# Patient Record
Sex: Male | Born: 1955 | Race: White | Hispanic: No | Marital: Married | Smoking: Former smoker
Health system: Southern US, Community
[De-identification: ages and names within clinical notes are randomized; demographics above are authoritative.]

## PROBLEM LIST (undated history)

## (undated) DIAGNOSIS — R002 Palpitations: Secondary | ICD-10-CM

## (undated) DIAGNOSIS — K449 Diaphragmatic hernia without obstruction or gangrene: Secondary | ICD-10-CM

## (undated) DIAGNOSIS — N419 Inflammatory disease of prostate, unspecified: Secondary | ICD-10-CM

## (undated) DIAGNOSIS — E785 Hyperlipidemia, unspecified: Secondary | ICD-10-CM

## (undated) DIAGNOSIS — G8929 Other chronic pain: Secondary | ICD-10-CM

## (undated) DIAGNOSIS — K824 Cholesterolosis of gallbladder: Secondary | ICD-10-CM

## (undated) DIAGNOSIS — K219 Gastro-esophageal reflux disease without esophagitis: Secondary | ICD-10-CM

## (undated) DIAGNOSIS — F419 Anxiety disorder, unspecified: Secondary | ICD-10-CM

## (undated) DIAGNOSIS — M549 Dorsalgia, unspecified: Secondary | ICD-10-CM

## (undated) HISTORY — DX: Inflammatory disease of prostate, unspecified: N41.9

## (undated) HISTORY — DX: Palpitations: R00.2

## (undated) HISTORY — DX: Hyperlipidemia, unspecified: E78.5

## (undated) HISTORY — DX: Diaphragmatic hernia without obstruction or gangrene: K44.9

## (undated) HISTORY — DX: Cholesterolosis of gallbladder: K82.4

---

## 1985-11-27 HISTORY — PX: BACK SURGERY: SHX140

## 1993-11-27 HISTORY — PX: SIGMOIDOSCOPY: SUR1295

## 1993-11-27 HISTORY — PX: ESOPHAGOGASTRODUODENOSCOPY: SHX1529

## 1999-11-25 HISTORY — PX: ESOPHAGOGASTRODUODENOSCOPY: SHX1529

## 1999-11-25 HISTORY — PX: SIGMOIDOSCOPY: SUR1295

## 2001-12-06 ENCOUNTER — Emergency Department (HOSPITAL_COMMUNITY): Admission: EM | Admit: 2001-12-06 | Discharge: 2001-12-06 | Payer: Self-pay | Admitting: *Deleted

## 2001-12-06 ENCOUNTER — Encounter: Payer: Self-pay | Admitting: *Deleted

## 2002-03-27 HISTORY — PX: COLONOSCOPY: SHX174

## 2002-03-28 ENCOUNTER — Ambulatory Visit (HOSPITAL_COMMUNITY): Admission: RE | Admit: 2002-03-28 | Discharge: 2002-03-28 | Payer: Self-pay | Admitting: Internal Medicine

## 2003-08-14 ENCOUNTER — Ambulatory Visit (HOSPITAL_COMMUNITY): Admission: RE | Admit: 2003-08-14 | Discharge: 2003-08-14 | Payer: Self-pay | Admitting: Family Medicine

## 2003-08-14 ENCOUNTER — Encounter: Payer: Self-pay | Admitting: Family Medicine

## 2003-08-18 ENCOUNTER — Ambulatory Visit (HOSPITAL_COMMUNITY): Admission: RE | Admit: 2003-08-18 | Discharge: 2003-08-18 | Payer: Self-pay | Admitting: *Deleted

## 2004-02-25 ENCOUNTER — Ambulatory Visit (HOSPITAL_COMMUNITY): Admission: RE | Admit: 2004-02-25 | Discharge: 2004-02-25 | Payer: Self-pay | Admitting: *Deleted

## 2005-03-07 ENCOUNTER — Ambulatory Visit (HOSPITAL_COMMUNITY): Admission: RE | Admit: 2005-03-07 | Discharge: 2005-03-07 | Payer: Self-pay | Admitting: Urology

## 2007-02-14 ENCOUNTER — Emergency Department (HOSPITAL_COMMUNITY): Admission: EM | Admit: 2007-02-14 | Discharge: 2007-02-14 | Payer: Self-pay | Admitting: Emergency Medicine

## 2007-11-13 ENCOUNTER — Emergency Department (HOSPITAL_COMMUNITY): Admission: EM | Admit: 2007-11-13 | Discharge: 2007-11-13 | Payer: Self-pay | Admitting: Emergency Medicine

## 2007-11-28 HISTORY — PX: ESOPHAGOGASTRODUODENOSCOPY: SHX1529

## 2007-12-09 ENCOUNTER — Ambulatory Visit: Payer: Self-pay | Admitting: Internal Medicine

## 2007-12-09 ENCOUNTER — Ambulatory Visit (HOSPITAL_COMMUNITY): Admission: RE | Admit: 2007-12-09 | Discharge: 2007-12-09 | Payer: Self-pay | Admitting: Internal Medicine

## 2008-02-26 ENCOUNTER — Ambulatory Visit: Payer: Self-pay | Admitting: Internal Medicine

## 2008-04-17 ENCOUNTER — Ambulatory Visit: Payer: Self-pay | Admitting: Internal Medicine

## 2010-04-18 ENCOUNTER — Emergency Department (HOSPITAL_COMMUNITY): Admission: EM | Admit: 2010-04-18 | Discharge: 2010-04-18 | Payer: Self-pay | Admitting: Emergency Medicine

## 2010-06-03 ENCOUNTER — Emergency Department (HOSPITAL_COMMUNITY): Admission: EM | Admit: 2010-06-03 | Discharge: 2010-06-03 | Payer: Self-pay | Admitting: Emergency Medicine

## 2010-06-08 ENCOUNTER — Ambulatory Visit (HOSPITAL_COMMUNITY): Admission: RE | Admit: 2010-06-08 | Discharge: 2010-06-08 | Payer: Self-pay | Admitting: Family Medicine

## 2010-07-05 ENCOUNTER — Encounter: Payer: Self-pay | Admitting: Internal Medicine

## 2010-07-05 ENCOUNTER — Ambulatory Visit: Payer: Self-pay | Admitting: Gastroenterology

## 2010-07-05 DIAGNOSIS — R1013 Epigastric pain: Secondary | ICD-10-CM | POA: Insufficient documentation

## 2010-07-05 DIAGNOSIS — K828 Other specified diseases of gallbladder: Secondary | ICD-10-CM | POA: Insufficient documentation

## 2010-07-05 DIAGNOSIS — Z860100 Personal history of colon polyps, unspecified: Secondary | ICD-10-CM | POA: Insufficient documentation

## 2010-07-08 ENCOUNTER — Encounter: Payer: Self-pay | Admitting: Gastroenterology

## 2010-07-11 LAB — CONVERTED CEMR LAB
Eosinophils Absolute: 0.3 10*3/uL (ref 0.0–0.7)
Eosinophils Relative: 6 % — ABNORMAL HIGH (ref 0–5)
HCT: 41.4 % (ref 39.0–52.0)
Hemoglobin: 14.1 g/dL (ref 13.0–17.0)
Lymphs Abs: 1.3 10*3/uL (ref 0.7–4.0)
MCV: 88.5 fL (ref 78.0–100.0)
Monocytes Relative: 9 % (ref 3–12)
Neutrophils Relative %: 62 % (ref 43–77)
RBC: 4.68 M/uL (ref 4.22–5.81)
WBC: 5.6 10*3/uL (ref 4.0–10.5)

## 2010-07-29 ENCOUNTER — Ambulatory Visit: Payer: Self-pay | Admitting: Internal Medicine

## 2010-07-29 ENCOUNTER — Ambulatory Visit (HOSPITAL_COMMUNITY): Admission: RE | Admit: 2010-07-29 | Discharge: 2010-07-29 | Payer: Self-pay | Admitting: Internal Medicine

## 2010-07-29 HISTORY — PX: ESOPHAGOGASTRODUODENOSCOPY: SHX1529

## 2010-07-29 HISTORY — PX: COLONOSCOPY: SHX174

## 2010-08-05 ENCOUNTER — Encounter (HOSPITAL_COMMUNITY): Admission: RE | Admit: 2010-08-05 | Discharge: 2010-08-05 | Payer: Self-pay | Admitting: Internal Medicine

## 2010-08-06 ENCOUNTER — Encounter: Payer: Self-pay | Admitting: Internal Medicine

## 2010-08-23 ENCOUNTER — Encounter (INDEPENDENT_AMBULATORY_CARE_PROVIDER_SITE_OTHER): Payer: Self-pay | Admitting: *Deleted

## 2010-12-29 NOTE — Letter (Signed)
Summary: TCS/EGD order   TCS/EGD order   Imported By: Peggyann Shoals 07/05/2010 11:42:41  _____________________________________________________________________  External Attachment:    Type:   Image     Comment:   External Document
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
## 2010-12-29 NOTE — Letter (Signed)
Summary: Recall Office Visit  [facility]  [address]   [place], [place] [address]   Phone: [phone]  Fax: [phone]      August 23, 2010   Jack Gilbert [address] [place], [place]  [address] 12/28/1955   Dear Mr. Badia,   According to our records, it is time for you to schedule a follow-up office visit with [place].   At your convenience, please call [phone] to schedule an office visit. If you have any questions, concerns, or feel that this letter is in error, we would appreciate your call.     [facility] Ph: [phone]   Fax: [phone]

## 2010-12-29 NOTE — Letter (Signed)
Summary: Patient Notice, Endo Biopsy Results  [facility]  [address]   [place], [place] [address]   Phone: [phone]  Fax: [phone]       August 06, 2010   THOAMS SIEFERT [address] [place], [place]  [address] 17-Nov-1956    Dear Mr. Furey,  I am pleased to inform you that the biopsies taken during your recent endoscopic examination did not show any evidence of cancer upon pathologic examination.  There was only mild inflammation in your stomach.  Additional information/recommendations:  Continue with the treatment plan as outlined on the day of your examination.  Please call us if you are having persistent problems or have questions about your condition that have not been fully answered at this time.  Sincerely,    R. Roetta Sessions MD, FACP [facility] Ph: [phone]   Fax: [phone]   Appended Document: Patient Notice, Endo Biopsy Results mailed letter to pt

## 2011-02-12 LAB — URINALYSIS, ROUTINE W REFLEX MICROSCOPIC
Glucose, UA: NEGATIVE mg/dL
Hgb urine dipstick: NEGATIVE
Protein, ur: NEGATIVE mg/dL

## 2011-02-12 LAB — BASIC METABOLIC PANEL
CO2: 25 mEq/L (ref 19–32)
Chloride: 107 mEq/L (ref 96–112)
Creatinine, Ser: 0.73 mg/dL (ref 0.4–1.5)
GFR calc Af Amer: >60 mL/min (ref >60)

## 2011-04-11 NOTE — Letter (Signed)
February 26, 2008    Scott A. Gerda Diss, MD  [address], [address]  [place], [place] [address]   RE:  Jack Gilbert, Jack Gilbert  MRN:  [id number]  /  DOB:  Jun 02, 1956   Dear Lorin Picket:   It was a pleasure to see Donell Tomkins today, although he came without  very much data.   He describes palpitations over the last 3 years which are accompanied by  fatigue, worse at night, worse on his left side.  He feels them in his  neck.  He has been told that he has premature beats.  He is not sure  if he has been told that they were atrial or ventricular.  He is not  quite sure that the diagnosis is even correct.   He has been submitted to a fairly extensive workup by the [facility], which has included an echo that was normal, a stress  test that was normal, 24-hour Holter monitoring, event loop recording  and LifeWatch monitoring.  Unfortunately, the latter three we do not  have.   His past medical history in addition to the above is notable for  fatigue, prostate issues and GE reflux disease as well as allergies.   He has a history of back surgery.   SOCIAL HISTORY:  He is married.  He is two children.  He does not use  cigarettes or recreational drugs.  He drinks a couple of beers a day.  He does not use caffeine.   He works as a Psychologist, occupational.   MEDICATIONS:  Metoprolol succinate 100 mg a day.   He has no known drug allergies.   EXAMINATION:  He is a middle-aged Caucasian male appearing his stated  age of 55.  His blood pressure is 126/77, his pulse was 70.  His weight was 184.  He is in no acute distress.  HEENT:  No icterus or xanthoma.  The neck veins were flat.  The carotids are brisk and full bilaterally  out bruits.  BACK:  Without kyphosis or scoliosis.  LUNGS:  Clear.  Heart sounds were regular without murmurs or gallops.  ABDOMEN:  Soft with active bowel sounds, without midline pulsation or  hepatomegaly.  Femoral pulses were 2+.  Distal pulses were intact.  There is no  clubbing, cyanosis or edema.  NEUROLOGIC:  Grossly normal.  SKIN:  Warm and dry.   Electrocardiogram today demonstrated sinus rhythm.     Electrocardiogram from your office that is undated except to assume  that it was done last year because it describes a 55 year old  demonstrates sinus rhythm at 68 with intervals of 0.16/0.8/0.36.  The  electrocardiogram was normal.   IMPRESSION:  1. Palpitations, question mechanism.  2. Anxiety related to #1.  3. Fairly extensive evaluation of heart muscle function, which is      normal.   Lorin Picket, Mr. Crescenzo has palpitations.  We will need to get the records from  [facility] to see what the show in terms of the  mechanism for this and then think about what therapies with might be  able to undertake to alleviate Mr. Jani's symptoms.   Thanks very much for the consultation.  I am sorry that we did have more  records with which to give a more definitive plan.    Sincerely,      Duke Salvia, MD, [facility]  Electronically Signed    SCK/MedQ  DD: 02/26/2008  DT: 02/27/2008  Job #: [id number]

## 2011-04-11 NOTE — Letter (Signed)
Apr 17, 2008    Scott A. Gerda Diss, MD  [address], [address]  [place], [place] [address]   RE:  PASQUAL, FARIAS  MRN:  [id number]  /  DOB:  1955-12-11   Dear Lorin Picket:   Mr. Deman came in today with the recordings that we had obtained from  the [facility] in [place].  These were  typical for his palpitations and they demonstrated PVCs, occurring both  infrequently, as well as in patterns of trigeminy.   He remains very anxious about this.  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   His current medications include metoprolol, he is taking 100 mg a day,  as well as vitamins.   PHYSICAL EXAMINATION:  VITAL SIGNS:  His blood pressure was 110/74.  The  pulse was 67.  His weight was 181, which is down 3 pounds.  LUNGS:  Clear.  HEART:  His heart sounds were regular.  EXTREMITIES:  Without edema.   IMPRESSION:  1. Symptomatic premature ventricular contractions.  2. Anxiety.   Lorin Picket, Guidant Mr. Bashor is doing pretty well.  I tried to reassure him  about the benign nature of his PVCs.  I have given him a prescription  for verapamil to take 40 mg on an as-needed basis to supplement his  metoprolol.   He asked that we sit down again at the end of the summer, so I have  scheduled him in September.   If there is anything I can do in the interim, please do not hesitate to  contact me.    Sincerely,      Duke Salvia, MD, [facility]  Electronically Signed    SCK/MedQ  DD: 04/17/2008  DT: 04/17/2008  Job #: [id number]

## 2011-04-11 NOTE — Op Note (Signed)
Jack Gilbert, Jack Gilbert               ACCOUNT NO.:  [id number]   MEDICAL RECORD NO.:  [id number]          PATIENT TYPE:  AMB   LOCATION:  DAY                           FACILITY:  [facility]   PHYSICIAN:  R. Roetta Sessions, M.D. DATE OF BIRTH:  December 28, 1955   DATE OF PROCEDURE:  12/09/2007  DATE OF DISCHARGE:                               OPERATIVE REPORT   S.  For indicating of the Diagnostic EGD cavity done below [id number] 03/03  days taken on 10/2008   INDICATIONS FOR PROCEDURE:  This 55 year old gentleman with a several  week history of vague and epigastric gnawing discomfort somewhat  relieved by eating.  He describes early satiety.  He has not had a  odynophagia, dysphagia, hematemesis, or melena.  He does chew tobacco.  He tells me H. pylori serologies were negative through Dr. Fletcher Anon  office recently. He had a __________  bout of nausea and vomiting  several weeks ago for which an ultrasound was done which revealed a  normal gallbladder.  H. pylori serologies were negative through Dr.  Fletcher Anon office recently.  Reportedly EGD is now being done.   He is not any acid suppression therapy.  There is no history of peptic  ulcer disease.  EGD is now being done.  This approach has been discussed  with the patient at length.  Potential risks, benefits, and alternatives  have been reviewed, questions answered.  He is agreeable.  Please see  documentation in the medical record.   PROCEDURE NOTE:  O2 saturation, blood pressure, pulse, and respirations  monitored throughout the entire procedure.   CONSCIOUS SEDATION:  Versed 4 mg IV, Demerol 100 mg IV in divided doses.   INSTRUMENT:  Pentax video chip system.   FINDINGS:  Examination of the tubular esophagus revealed entirely normal  mucosa.  EG junction easily traversed.   STOMACH:  Gastric cavity was emptied, insufflated well with air.  A  thorough examination of the gastric mucosa including retroflexion  through the proximal stomach,  esophagogastric junction demonstrated no  abnormalities.  Pylorus patent, and easily traversed.  Examination of  the bulb, and second portion revealed no abnormalities.   THERAPEUTIC/DIAGNOSTIC MANEUVERS PERFORMED:  None.   The patient tolerated the procedure well and was reacted in endoscopy.   IMPRESSION:  Normal esophagus, stomach D1, D2.   RECOMMENDATIONS:  Will treat for non-ulcer dyspepsia for the time-being.  AcipHex 20 mg orally daily before breakfast.  He is to go to by my  office for samples for a month.  We will see him back in 1 month, and  see how he is doing.  Depending on his response to therapy, further  evaluation may be needed.      [name], M.D.  Electronically Signed     RMR/MEDQ  D:  12/09/2007  T:  12/09/2007  Job:  [id number]   cc:   Dr. Gerda Diss

## 2011-04-14 NOTE — Procedures (Signed)
Jack Gilbert, Jack Gilbert NO.:  [id number]   MEDICAL RECORD NO.:  [id number]                  PATIENT TYPE:  PREC   LOCATION:                                       FACILITY:   PHYSICIAN:  Vida Roller, M.D.                DATE OF BIRTH:  05-14-1956   DATE OF PROCEDURE:  DATE OF DISCHARGE:                                    STRESS TEST   PROCEDURE:  Exercise Cardiolite.   INDICATION:  Jack Gilbert is a 55 year old gentleman with no known coronary  artery disease who presents with atypical chest discomfort.   CARDIAC RISK FACTORS:  Tobacco and hypertension.   BASELINE DATA:  EKG reveals sinus rhythm at 71 beats per minute with  nonspecific ST - abnormalities.  Blood pressure 142/78.   DESCRIPTION OF PROCEDURE:  The patient exercised for a total of 7 minutes to  Bruce protocol stage 3, and 10.1 mets.  Maximum heart rate is 164 beats per  minute which is 94% of predicted maximum.  The maximum blood pressure is  192/88.  This resolved down to 128/68 in recovery.  The patient reported a  left anterior rib discomfort which was mild and resolved quickly in  recovery.  EKG revealed no ischemic changes and no arrhythmias.  The test  was stopped secondary to fatigue.   FINAL IMAGES AND RESULTS:  Pending M.D. review.     ________________________________________  ___________________________________________  Jae Dire, P.A. [facility]                      Vida Roller, M.D.   AB/MEDQ  D:  02/25/2004  T:  02/25/2004  Job:  [id number]

## 2011-04-14 NOTE — Procedures (Signed)
   NAME:  Jack Gilbert, Jack Gilbert                         ACCOUNT NO.:  [id number]   MEDICAL RECORD NO.:  [id number]                   PATIENT TYPE:  OUT   LOCATION:  RAD                                  FACILITY:  [facility]   PHYSICIAN:  Vida Roller, M.D.                DATE OF BIRTH:  05-09-1956   DATE OF PROCEDURE:  08/18/2003  DATE OF DISCHARGE:                                  ECHOCARDIOGRAM   TAPE NUMBER:  [id number].   TAPE COUNT:  [id number].   INDICATIONS FOR PROCEDURE:  This is a 55 year old gentleman with presyncope.   TECHNICAL QUALITY:  Technical quality of the study is adequate.   M-MODE TRACINGS:  The aorta is 32 mm.   Left atrium is 32 mm.   The septum is 10 mm.   Left ventricular posterior wall is 9 mm.   Left ventricular diastolic dimension is 47 mm.   Left ventricular posterior wall is 9 mm.   Left ventricular diastolic dimension is 47 mm.   Left ventricular systolic dimension is 28 mm.   2-D AND DOPPLER IMAGING:  The left ventricular is normal size with normal  systolic function. There are no wall motion abnormalities. Diastolic  function is normal.   The right ventricle is normal size with normal systolic function. No wall  motion abnormalities are seen.   Both atria appear to be normal size. There is no atrial septal defect.   The aortic valve is morphologically unremarkable with no stenosis or  regurgitation.   The mitral valve is morphologically unremarkable with no stenosis or  regurgiation.   The tricuspid valve has trace insufficiency but no stenosis is seen.   The pulmonic valve shows trace insufficiency. No stenosis is seen.   Pericardial structures appear normal.   The inferior vena cava appears normal.   The ascending aorta is not well seen.      ___________________________________________                                            Vida Roller, M.D.   JH/MEDQ  D:  08/18/2003  T:  08/18/2003  Job:  [id number]   cc:   Donna Bernard, M.D.  [address]  [place]  [place] [address]  Fax: [phone]

## 2011-04-14 NOTE — Op Note (Signed)
[facility]  Patient:    [name], [name] Visit Number: [id number] MRN: [id number]          Service Type: END Location: DAY Attending Physician:  [name] Dictated by:   Roetta Sessions, M.D. Proc. Date: 03/28/02 Admit Date:  03/28/2002 Discharge Date: 03/28/2002   CC:         Butch Penny, M.D.   Operative Report  PROCEDURE:  Diagnostic colonoscopy.  INDICATIONS FOR PROCEDURE:  The patient is a 55 year old gentleman with vague left lower quadrant abdominal pain. CT at [facility] demonstrated some mild diverticular changes of the sigmoid colon but no inflammatory process was seen. Urinalysis repeat has been normal. Colonoscopy is now being done for the above mentioned reasons. The approach has been discussed with Jack Gilbert. The potential risks, benefits, and alternatives have been reviewed, questions answered and he is agreeable. Please see my handwritten H&P for more information.  MONITORING:  O2 saturations, blood pressure, pulse and respirations were monitored throughout the entire procedure.  CONSCIOUS SEDATION:  Versed 3 mg IV, Demerol 50 IV in divided doses.  INSTRUMENT:  Olympus video colonoscope.  FINDINGS:  Digital rectal exam revealed no abnormalities.  ENDOSCOPIC FINDINGS:  The prep was good.  RECTUM:  Examination of the rectal mucosal including retroflexed view of the anal verge revealed no abnormalities.  COLON:  The colonic mucosa was surveyed from the rectosigmoid junction through the left transverse right colon to the area of the appendiceal orifice, ileocecal valve, and cecum. These structures were well seen and photographed for the record. No colonic mucosal abnormalities were noted upon advancing the scope to the cecum. From the level of the cecum and ileocecal valve, the scope was slowly withdrawn. All previously mentioned mucosal surfaces were again seen and again no abnormalities were  observed. The patient tolerated the procedure well and was reactive in endoscopy.  IMPRESSION:  1. Normal rectum.  2. Normal colon.  No explanation for the patients left lower quadrant abdominal pain. He had mild diverticular changes on CT scan; however, if he does not have any typical changes of diverticulosis based on this colonoscopy, he may have very shallow lesions but he did not have any typical diverticula endoscopically.  RECOMMENDATIONS:  1. Bolster fiber intake and a daily Metamucil or Citrucel fiber supplement.  2. Follow-up with Dr. Renard Matter.  3. Call me if symptoms do not get better. Dictated by:   Roetta Sessions, M.D. Attending Physician:  [name] DD:  03/28/02 TD:  03/31/02 Job: [id number] ON/GE952

## 2011-09-01 LAB — CBC
HCT: 46
Hemoglobin: 15.9
MCHC: 34.5
MCV: 87.7
Platelets: 214
RBC: 5.24
RDW: 12.4
WBC: 7.6

## 2011-09-01 LAB — DIFFERENTIAL
Basophils Absolute: 0
Basophils Relative: 0
Eosinophils Relative: 2
Monocytes Absolute: 0.4

## 2011-09-01 LAB — HEPATIC FUNCTION PANEL
ALT: 54 — ABNORMAL HIGH
AST: 32
Albumin: 4
Alkaline Phosphatase: 71
Bilirubin, Direct: 0.1
Indirect Bilirubin: 0.9
Total Bilirubin: 1
Total Protein: 6.8

## 2011-09-01 LAB — BASIC METABOLIC PANEL
CO2: 23
Calcium: 8.9
Chloride: 103
Glucose, Bld: 115 — ABNORMAL HIGH
Potassium: 3.9
Sodium: 138

## 2011-12-29 ENCOUNTER — Ambulatory Visit (HOSPITAL_COMMUNITY)
Admission: RE | Admit: 2011-12-29 | Discharge: 2011-12-29 | Disposition: A | Discharge status: Home / Self Care | Payer: BC Managed Care – PPO | Source: Ambulatory Visit | Attending: Family Medicine | Admitting: Family Medicine

## 2011-12-29 ENCOUNTER — Other Ambulatory Visit: Payer: Self-pay | Admitting: Family Medicine

## 2011-12-29 DIAGNOSIS — M546 Pain in thoracic spine: Secondary | ICD-10-CM

## 2011-12-29 DIAGNOSIS — M4120 Other idiopathic scoliosis, site unspecified: Secondary | ICD-10-CM | POA: Insufficient documentation

## 2012-01-02 ENCOUNTER — Other Ambulatory Visit: Payer: Self-pay | Admitting: Family Medicine

## 2012-01-02 DIAGNOSIS — M546 Pain in thoracic spine: Secondary | ICD-10-CM

## 2012-01-05 ENCOUNTER — Ambulatory Visit (HOSPITAL_COMMUNITY)
Admission: RE | Admit: 2012-01-05 | Discharge: 2012-01-05 | Disposition: A | Discharge status: Home / Self Care | Payer: BC Managed Care – PPO | Source: Ambulatory Visit | Attending: Family Medicine | Admitting: Family Medicine

## 2012-01-05 ENCOUNTER — Other Ambulatory Visit: Payer: Self-pay | Admitting: Family Medicine

## 2012-01-05 DIAGNOSIS — M5124 Other intervertebral disc displacement, thoracic region: Secondary | ICD-10-CM | POA: Insufficient documentation

## 2012-01-05 DIAGNOSIS — M47814 Spondylosis without myelopathy or radiculopathy, thoracic region: Secondary | ICD-10-CM | POA: Insufficient documentation

## 2012-01-05 DIAGNOSIS — Z01818 Encounter for other preprocedural examination: Secondary | ICD-10-CM

## 2012-01-05 DIAGNOSIS — M546 Pain in thoracic spine: Secondary | ICD-10-CM | POA: Insufficient documentation

## 2013-02-28 ENCOUNTER — Encounter: Payer: Self-pay | Admitting: Internal Medicine

## 2013-02-28 ENCOUNTER — Ambulatory Visit (INDEPENDENT_AMBULATORY_CARE_PROVIDER_SITE_OTHER): Payer: BC Managed Care – PPO | Admitting: Internal Medicine

## 2013-02-28 VITALS — BP 115/68 | HR 70 | Temp 97.4°F | Ht 71.0 in | Wt 187.6 lb

## 2013-02-28 DIAGNOSIS — R1011 Right upper quadrant pain: Secondary | ICD-10-CM

## 2013-02-28 DIAGNOSIS — R1013 Epigastric pain: Secondary | ICD-10-CM

## 2013-02-28 DIAGNOSIS — K3189 Other diseases of stomach and duodenum: Secondary | ICD-10-CM

## 2013-02-28 DIAGNOSIS — K219 Gastro-esophageal reflux disease without esophagitis: Secondary | ICD-10-CM

## 2013-02-28 DIAGNOSIS — G8929 Other chronic pain: Secondary | ICD-10-CM

## 2013-02-28 NOTE — Patient Instructions (Addendum)
Return stool sample for occult blood testing  Gallbladder ultrasound to follow-up on polyps  Trial of Dexilant 60 mg daily; samples provided; stop Protonix (pantoprazole)   Further recommendations  CC: PCP
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
## 2013-03-03 ENCOUNTER — Ambulatory Visit (INDEPENDENT_AMBULATORY_CARE_PROVIDER_SITE_OTHER): Payer: BC Managed Care – PPO | Admitting: Internal Medicine

## 2013-03-03 ENCOUNTER — Other Ambulatory Visit: Payer: Self-pay | Admitting: Internal Medicine

## 2013-03-03 ENCOUNTER — Ambulatory Visit (HOSPITAL_COMMUNITY)
Admission: RE | Admit: 2013-03-03 | Discharge: 2013-03-03 | Disposition: A | Discharge status: Home / Self Care | Payer: BC Managed Care – PPO | Source: Ambulatory Visit | Attending: Internal Medicine | Admitting: Internal Medicine

## 2013-03-03 DIAGNOSIS — R1011 Right upper quadrant pain: Secondary | ICD-10-CM

## 2013-03-03 DIAGNOSIS — K824 Cholesterolosis of gallbladder: Secondary | ICD-10-CM | POA: Insufficient documentation

## 2013-03-04 ENCOUNTER — Ambulatory Visit (HOSPITAL_COMMUNITY): Payer: BC Managed Care – PPO

## 2013-03-05 ENCOUNTER — Other Ambulatory Visit: Payer: Self-pay | Admitting: Internal Medicine

## 2013-03-05 DIAGNOSIS — R1013 Epigastric pain: Secondary | ICD-10-CM

## 2013-03-05 DIAGNOSIS — R1011 Right upper quadrant pain: Secondary | ICD-10-CM

## 2013-03-05 DIAGNOSIS — K219 Gastro-esophageal reflux disease without esophagitis: Secondary | ICD-10-CM

## 2013-03-05 MED ORDER — PEG 3350-KCL-NA BICARB-NACL 420 G PO SOLR: 4000.0000 mL | ORAL | Status: DC

## 2013-03-05 NOTE — Progress Notes (Signed)
Patient is scheduled with RMR on 04/25 I have spoken to his wife and I have mailed the instructions

## 2013-03-11 ENCOUNTER — Encounter (HOSPITAL_COMMUNITY): Payer: Self-pay | Admitting: Pharmacy Technician

## 2013-03-21 ENCOUNTER — Encounter (HOSPITAL_COMMUNITY)
Admission: RE | Disposition: A | Discharge status: Home / Self Care | Payer: Self-pay | Source: Ambulatory Visit | Attending: Internal Medicine

## 2013-03-21 ENCOUNTER — Ambulatory Visit (HOSPITAL_COMMUNITY)
Admission: RE | Admit: 2013-03-21 | Discharge: 2013-03-21 | Disposition: A | Discharge status: Home / Self Care | Payer: BC Managed Care – PPO | Source: Ambulatory Visit | Attending: Internal Medicine | Admitting: Internal Medicine

## 2013-03-21 ENCOUNTER — Encounter (HOSPITAL_COMMUNITY): Payer: Self-pay | Admitting: *Deleted

## 2013-03-21 DIAGNOSIS — K621 Rectal polyp: Secondary | ICD-10-CM

## 2013-03-21 DIAGNOSIS — D128 Benign neoplasm of rectum: Secondary | ICD-10-CM | POA: Insufficient documentation

## 2013-03-21 DIAGNOSIS — K219 Gastro-esophageal reflux disease without esophagitis: Secondary | ICD-10-CM

## 2013-03-21 DIAGNOSIS — K620 Anal polyp: Secondary | ICD-10-CM

## 2013-03-21 DIAGNOSIS — R1011 Right upper quadrant pain: Secondary | ICD-10-CM

## 2013-03-21 DIAGNOSIS — R1013 Epigastric pain: Secondary | ICD-10-CM

## 2013-03-21 DIAGNOSIS — R109 Unspecified abdominal pain: Secondary | ICD-10-CM

## 2013-03-21 DIAGNOSIS — K449 Diaphragmatic hernia without obstruction or gangrene: Secondary | ICD-10-CM | POA: Insufficient documentation

## 2013-03-21 DIAGNOSIS — R195 Other fecal abnormalities: Secondary | ICD-10-CM

## 2013-03-21 HISTORY — PX: COLONOSCOPY WITH ESOPHAGOGASTRODUODENOSCOPY (EGD): SHX5779

## 2013-03-21 SURGERY — COLONOSCOPY WITH ESOPHAGOGASTRODUODENOSCOPY (EGD)
Anesthesia: Moderate Sedation

## 2013-03-21 MED ORDER — SODIUM CHLORIDE 0.9 % IV SOLN
INTRAVENOUS | Status: DC
  Administered 2013-03-21: 1000 mL via INTRAVENOUS

## 2013-03-21 MED ORDER — ONDANSETRON HCL 4 MG/2ML IJ SOLN
INTRAMUSCULAR | Status: AC
  Filled 2013-03-21: qty 2

## 2013-03-21 MED ORDER — MIDAZOLAM HCL 5 MG/5ML IJ SOLN
INTRAMUSCULAR | Status: AC
  Filled 2013-03-21: qty 10

## 2013-03-21 MED ORDER — MEPERIDINE HCL 100 MG/ML IJ SOLN
INTRAMUSCULAR | Status: DC | PRN
  Administered 2013-03-21 (×2): 50 mg via INTRAVENOUS

## 2013-03-21 MED ORDER — MEPERIDINE HCL 100 MG/ML IJ SOLN
INTRAMUSCULAR | Status: AC
  Filled 2013-03-21: qty 2

## 2013-03-21 MED ORDER — STERILE WATER FOR IRRIGATION IR SOLN
Status: DC | PRN
  Administered 2013-03-21: 14:00:00

## 2013-03-21 MED ORDER — MIDAZOLAM HCL 5 MG/5ML IJ SOLN
INTRAMUSCULAR | Status: DC | PRN
  Administered 2013-03-21 (×3): 2 mg via INTRAVENOUS

## 2013-03-21 MED ORDER — ONDANSETRON HCL 4 MG/2ML IJ SOLN
INTRAMUSCULAR | Status: DC | PRN
  Administered 2013-03-21: 4 mg via INTRAVENOUS

## 2013-03-21 NOTE — Op Note (Signed)
[facility] [address] [place], [address]   COLONOSCOPY PROCEDURE REPORT  PATIENT: Jack, Gilbert  MR#:         [id number] BIRTHDATE: 03/07/1956 , 56  yrs. old GENDER: Male ENDOSCOPIST: R.  Roetta Sessions, MD FACP FACG REFERRED BY:  Lilyan Punt, M.D. PROCEDURE DATE:  03/21/2013 PROCEDURE:      Colonoscopy with snare polypectomy  INDICATIONS:   Occult blood positive stool  INFORMED CONSENT:  The risks, benefits, alternatives and imponderables including but not limited to bleeding, perforation as well as the possibility of a missed lesion have been reviewed.  The potential for biopsy, lesion removal, etc. have also been discussed.  Questions have been answered.  All parties agreeable. Please see the history and physical in the medical record for more information.  MEDICATIONS: Versed 6 mg IV and Demerol 100 mg IV in divided doses. Zofran 4 mg  DESCRIPTION OF PROCEDURE:  After a digital rectal exam was performed, the EC-[id number] ([id number])  colonoscope was advanced from the anus through the rectum and colon to the area of the cecum, ileocecal valve and appendiceal orifice.  The cecum was deeply intubated.  These structures were well-seen and photographed for the record.  From the level of the cecum and ileocecal valve, the scope was slowly and cautiously withdrawn.  The mucosal surfaces were carefully surveyed utilizing scope tip deflection to facilitate fold flattening as needed.  The scope was pulled down into the rectum where a thorough examination including retroflexion was performed.    FINDINGS:  (1) 3 mm polyp in the rectum  - 7 cm in from the anal verge; otherwise, the remainder of the rectal mucosa appeared normal. Normal appearing colonic mucosa.  THERAPEUTIC / DIAGNOSTIC MANEUVERS PERFORMED:  The above-mentioned rectal polyp was hot snare removed and recovered.  COMPLICATIONS: None  CECAL WITHDRAWAL TIME:  10 minutes  IMPRESSION:  Rectal  polyp-removed as described above  RECOMMENDATIONS: Followup on pathology.  We'll proceed with a HIDA with CCK challenge to reassess  gallbladder function. See EGD report   _______________________________ eSigned:  R. Roetta Sessions, MD FACP [facility] 03/21/2013 2:41 PM   CC:    PATIENT NAME:  Jack, Gilbert MR#: [id number]

## 2013-03-21 NOTE — Op Note (Signed)
[facility] [address] [place], [address]   ENDOSCOPY PROCEDURE REPORT  PATIENT: Jack, Gilbert  MR#: [id number] BIRTHDATE: Jan 31, 1956 , 56  yrs. old GENDER: Male ENDOSCOPIST: R.  Roetta Sessions, MD FACP FACG REFERRED BY:  Lilyan Punt, M.D. PROCEDURE DATE:  03/21/2013 PROCEDURE:     EGD-diagnostic  INDICATIONS:     upper abdominal pain; occult blood positive stool  INFORMED CONSENT:   The risks, benefits, limitations, alternatives and imponderables have been discussed.  The potential for biopsy, esophogeal dilation, etc. have also been reviewed.  Questions have been answered.  All parties agreeable.  Please see the history and physical in the medical record for more information.  MEDICATIONS:   Versed 4 mg IV and Demerol 100 mg IV in divided doses. Zofran 4 mg IV and Cetacaine spray  DESCRIPTION OF PROCEDURE:   The AO-[id number] ([id number])  endoscope was introduced through the mouth and advanced to the second portion of the duodenum without difficulty or limitations.  The mucosal surfaces were surveyed very carefully during advancement of the scope and upon withdrawal.  Retroflexion view of the proximal stomach and esophagogastric junction was performed.      FINDINGS:    Normal esophagus. Stomach empty. Normal-appearing gastric mucosa. One to 2 cm hiatal hernia. Patent pylorus. First and second portion of the duodenum.  THERAPEUTIC / DIAGNOSTIC MANEUVERS PERFORMED:  None   COMPLICATIONS:  None  IMPRESSION:  Small hiatal hernia; otherwise normal examination  RECOMMENDATIONS:  See colonoscopy report    _______________________________ R. Roetta Sessions, MD FACP [facility] eSigned:  R. Roetta Sessions, MD FACP [facility] 03/21/2013 2:23 PM     CC:  PATIENT NAME:  Jack, Gilbert MR#: [id number]
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
## 2013-03-21 NOTE — Interval H&P Note (Signed)
History and Physical Interval Note:  03/21/2013 2:05 PM  Jack Gilbert  has presented today for surgery, with the diagnosis of RUQ ABD PAIN, GERD, DYSPEPSIA  The various methods of treatment have been discussed with the patient and family. After consideration of risks, benefits and other options for treatment, the patient has consented to  Procedure(s) with comments: COLONOSCOPY WITH ESOPHAGOGASTRODUODENOSCOPY (EGD) (N/A) - 2:00-moved to 2:15 Jack Gilbert to notify pt as a surgical intervention .  The patient's history has been reviewed, patient examined, no change in status, stable for surgery.  I have reviewed the patient's chart and labs.  Questions were answered to the patient's satisfaction.     EGD colonoscopy per plan.The risks, benefits, limitations, imponderables and alternatives regarding both EGD and colonoscopy have been reviewed with the patient. Questions have been answered. All parties agreeable.    Jack Gilbert

## 2013-03-24 ENCOUNTER — Telehealth: Payer: Self-pay | Admitting: Internal Medicine

## 2013-03-24 ENCOUNTER — Other Ambulatory Visit: Payer: Self-pay | Admitting: Internal Medicine

## 2013-03-24 DIAGNOSIS — R110 Nausea: Secondary | ICD-10-CM

## 2013-03-24 DIAGNOSIS — R1011 Right upper quadrant pain: Secondary | ICD-10-CM

## 2013-03-24 NOTE — Telephone Encounter (Signed)
Patient is scheduled for HIDA on Wednesday April 30th at 1-:00 am and he is aware

## 2013-03-25 ENCOUNTER — Encounter (HOSPITAL_COMMUNITY): Payer: Self-pay | Admitting: Internal Medicine

## 2013-03-26 ENCOUNTER — Encounter (HOSPITAL_COMMUNITY): Payer: Self-pay

## 2013-03-26 ENCOUNTER — Encounter: Payer: Self-pay | Admitting: Internal Medicine

## 2013-03-26 ENCOUNTER — Encounter (HOSPITAL_COMMUNITY)
Admission: RE | Admit: 2013-03-26 | Discharge: 2013-03-26 | Disposition: A | Discharge status: Home / Self Care | Payer: BC Managed Care – PPO | Source: Ambulatory Visit | Attending: Internal Medicine | Admitting: Internal Medicine

## 2013-03-26 DIAGNOSIS — R1011 Right upper quadrant pain: Secondary | ICD-10-CM

## 2013-03-26 DIAGNOSIS — R110 Nausea: Secondary | ICD-10-CM

## 2013-03-26 MED ORDER — SINCALIDE 5 MCG IJ SOLR
INTRAMUSCULAR | Status: AC
  Administered 2013-03-26: 1.68 ug via INTRAVENOUS
  Filled 2013-03-26: qty 5

## 2013-03-26 MED ORDER — TECHNETIUM TC 99M MEBROFENIN IV KIT
5.0000 | PACK | Freq: Once | INTRAVENOUS | Status: AC | PRN
  Administered 2013-03-26: 5 via INTRAVENOUS

## 2013-03-27 NOTE — Progress Notes (Signed)
Referral faxed to Dr. Jenkins/Ziegler  

## 2013-03-28 NOTE — Telephone Encounter (Signed)
Patient is scheduled with Dr. Lovell Sheehan on 04/03/13 at 9:30 an he is aware

## 2013-04-03 NOTE — H&P (Signed)
  NTS SOAP Note  Vital Signs:  Vitals as of: 04/03/2013: Systolic 148: Diastolic 89: Heart Rate 80: Temp 98.66F: Height 8ft 11in: Weight 182Lbs 0 Ounces: Pain Level 7: BMI 25.38  BMI : 25.38 kg/m2  Subjective: This 38 Years 61 Months old Male presents for of    ABDOMINAL ISSUES: ,Has been having right upper quadrant abdominal pain, nausea, and bloating for some time now.  U/S in past showed small gallbladder polyp.  HIDA shows minimal gallbladder ejection fraction with reproducible symptoms with cck.  No fever, chills, jaundice.  Review of Symptoms:  Constitutional:unremarkable   Head:unremarkable    Eyes:unremarkable   sinus problems Cardiovascular:  unremarkable   Respiratory:unremarkable   Gastrointestin    abdominal pain, nausea Genitourinary:unremarkable     Musculoskeletal:unremarkable   Skin:unremarkable Hematolgic/Lymphatic:unremarkable       hay fever   Past Medical History:    Reviewed   Past Medical History  Surgical History: back surgery Medical Problems: none Allergies: nkda Medications: none   Social History:Reviewed  Social History  Preferred Language: English Race:  White Ethnicity: Not Hispanic / Latino Age: 57 Years 4 Months Marital Status:  M Alcohol:  Yes, How much 2 beers per day  Recreational drug(s):  No   Smoking Status: Never smoker reviewed on 04/03/2013 Functional Status reviewed on mm/dd/yyyy ------------------------------------------------ Bathing: Normal Cooking: Normal Dressing: Normal Driving: Normal Eating: Normal Managing Meds: Normal Oral Care: Normal Shopping: Normal Toileting: Normal Transferring: Normal Walking: Normal Cognitive Status reviewed on mm/dd/yyyy ------------------------------------------------ Attention: Normal Decision Making: Normal Language: Normal Memory: Normal Motor: Normal Perception: Normal Problem Solving: Normal Visual and Spatial:  Normal   Family History:  Reviewed   Family History  Is there a family history [id number]    Objective Information: General:  Well appearing, well nourished in no distress.   no scleral icterus Throat:   Neck:  Supple without lymphadenopathy.  Heart:  RRR, no murmur Lungs:    CTA bilaterally, no wheezes, rhonchi, rales.  Breathing unlabored. Abdomen:Soft, NT/ND, no HSM, no masses.  Assessment:Chronic cholecystitis  Diagnosis &amp; Procedure Smart Code   Plan:Scheduled for laparoscopic cholecystectomy on 04/09/13.   Patient Education:Alternative treatments to surgery were discussed with patient (and family).  Risks and benefits  of procedure including bleeding, infection, hepatobiliary injury, and the possibility of an open procedure were fully explained to the patient (and family) who gave informed consent. Patient/family questions were addressed.  Follow-up:Pending Surgery

## 2013-04-04 NOTE — Patient Instructions (Addendum)
Jack Gilbert  04/04/2013   Your procedure is scheduled on:  04/09/2013  Report to [facility] at  700  AM.  Call this number if you have problems the morning of surgery: [phone]   Remember:   Do not eat food or drink liquids after midnight.   Take these medicines the morning of surgery with A SIP OF WATER: claritin,toprol, protonix   Do not wear jewelry, make-up or nail polish.  Do not wear lotions, powders, or perfumes.   Do not shave 48 hours prior to surgery. Men may shave face and neck.  Do not bring valuables to the hospital.  Contacts, dentures or bridgework may not be worn into surgery.  Leave suitcase in the car. After surgery it may be brought to your room.  For patients admitted to the hospital, checkout time is 11:00 AM the day of discharge.   Patients discharged the day of surgery will not be allowed to drive  home.  Name and phone number of your driver: famly  Special Instructions: Shower using CHG 2 nights before surgery and the night before surgery.  If you shower the day of surgery use CHG.  Use special wash - you have one bottle of CHG for all showers.  You should use approximately 1/3 of the bottle for each shower.   Please read over the following fact sheets that you were given: Pain Booklet, Coughing and Deep Breathing, MRSA Information, Surgical Site Infection Prevention, Anesthesia Post-op Instructions and Care and Recovery After Surgery Laparoscopic Cholecystectomy Laparoscopic cholecystectomy is surgery to remove the gallbladder. The gallbladder is located slightly to the right of center in the abdomen, behind the liver. It is a concentrating and storage sac for the bile produced in the liver. Bile aids in the digestion and absorption of fats. Gallbladder disease (cholecystitis) is an inflammation of your gallbladder. This condition is usually caused by a buildup of gallstones (cholelithiasis) in your gallbladder. Gallstones can block the flow of bile,  resulting in inflammation and pain. In severe cases, emergency surgery may be required. When emergency surgery is not required, you will have time to prepare for the procedure. Laparoscopic surgery is an alternative to open surgery. Laparoscopic surgery usually has a shorter recovery time. Your common bile duct may also need to be examined and explored. Your caregiver will discuss this with you if he or she feels this should be done. If stones are found in the common bile duct, they may be removed. LET YOUR CAREGIVER KNOW ABOUT:  Allergies to food or medicine.  Medicines taken, including vitamins, herbs, eyedrops, over-the-counter medicines, and creams.  Use of steroids (by mouth or creams).  Previous problems with anesthetics or numbing medicines.  History of bleeding problems or blood clots.  Previous surgery.  Other health problems, including diabetes and kidney problems.  Possibility of pregnancy, if this applies. RISKS AND COMPLICATIONS All surgery is associated with risks. Some problems that may occur following this procedure include:  Infection.  Damage to the common bile duct, nerves, arteries, veins, or other internal organs such as the stomach or intestines.  Bleeding.  A stone may remain in the common bile duct. BEFORE THE PROCEDURE  Do not take aspirin for 3 days prior to surgery or blood thinners for 1 week prior to surgery.  Do not eat or drink anything after midnight the night before surgery.  Let your caregiver know if you develop a cold or other infectious problem prior to surgery.  You should be present 60 minutes before the procedure or as directed. PROCEDURE  You will be given medicine that makes you sleep (general anesthetic). When you are asleep, your surgeon will make several small cuts (incisions) in your abdomen. One of these incisions is used to insert a small, lighted scope (laparoscope) into the abdomen. The laparoscope helps the surgeon see into  your abdomen. Carbon dioxide gas will be pumped into your abdomen. The gas allows more room for the surgeon to perform your surgery. Other operating instruments are inserted through the other incisions. Laparoscopic procedures may not be appropriate when:  There is major scarring from previous surgery.  The gallbladder is extremely inflamed.  There are bleeding disorders or unexpected cirrhosis of the liver.  A pregnancy is near term.  Other conditions make the laparoscopic procedure impossible. If your surgeon feels it is not safe to continue with a laparoscopic procedure, he or she will perform an open abdominal procedure. In this case, the surgeon will make an incision to open the abdomen. This gives the surgeon a larger view and field to work within. This may allow the surgeon to perform procedures that sometimes cannot be performed with a laparoscope alone. Open surgery has a longer recovery time. AFTER THE PROCEDURE  You will be taken to the recovery area where a nurse will watch and check your progress.  You may be allowed to go home the same day.  Do not resume physical activities until directed by your caregiver.  You may resume a normal diet and activities as directed. Document Released: 11/13/2005 Document Revised: 02/05/2012 Document Reviewed: 04/28/2011 [facility] Patient Information 2013 [place], [place]. PATIENT INSTRUCTIONS POST-ANESTHESIA  IMMEDIATELY FOLLOWING SURGERY:  Do not drive or operate machinery for the first twenty four hours after surgery.  Do not make any important decisions for twenty four hours after surgery or while taking narcotic pain medications or sedatives.  If you develop intractable nausea and vomiting or a severe headache please notify your doctor immediately.  FOLLOW-UP:  Please make an appointment with your surgeon as instructed. You do not need to follow up with anesthesia unless specifically instructed to do so.  WOUND CARE INSTRUCTIONS (if  applicable):  Keep a dry clean dressing on the anesthesia/puncture wound site if there is drainage.  Once the wound has quit draining you may leave it open to air.  Generally you should leave the bandage intact for twenty four hours unless there is drainage.  If the epidural site drains for more than 36-48 hours please call the anesthesia department.  QUESTIONS?:  Please feel free to call your physician or the hospital operator if you have any questions, and they will be happy to assist you.

## 2013-04-07 ENCOUNTER — Encounter (HOSPITAL_COMMUNITY): Payer: Self-pay | Admitting: Pharmacy Technician

## 2013-04-07 ENCOUNTER — Encounter (HOSPITAL_COMMUNITY): Payer: Self-pay

## 2013-04-07 ENCOUNTER — Other Ambulatory Visit: Payer: Self-pay

## 2013-04-07 ENCOUNTER — Encounter (HOSPITAL_COMMUNITY)
Admission: RE | Admit: 2013-04-07 | Discharge: 2013-04-07 | Disposition: A | Discharge status: Home / Self Care | Payer: BC Managed Care – PPO | Source: Ambulatory Visit | Attending: General Surgery | Admitting: General Surgery

## 2013-04-07 HISTORY — DX: Gastro-esophageal reflux disease without esophagitis: K21.9

## 2013-04-07 LAB — HEPATIC FUNCTION PANEL
ALT: 21 U/L (ref 0–53)
Alkaline Phosphatase: 84 U/L (ref 39–117)
Bilirubin, Direct: 0.1 mg/dL (ref 0.0–0.3)
Indirect Bilirubin: 0.4 mg/dL (ref 0.3–0.9)
Total Bilirubin: 0.5 mg/dL (ref 0.3–1.2)

## 2013-04-07 LAB — CBC WITH DIFFERENTIAL/PLATELET
Basophils Absolute: 0 10*3/uL (ref 0.0–0.1)
Eosinophils Absolute: 0.3 10*3/uL (ref 0.0–0.7)
Eosinophils Relative: 7 % — ABNORMAL HIGH (ref 0–5)
Lymphocytes Relative: 29 % (ref 12–46)
Lymphs Abs: 1.1 10*3/uL (ref 0.7–4.0)
Neutrophils Relative %: 55 % (ref 43–77)
Platelets: 227 10*3/uL (ref 150–400)
RBC: 5.12 MIL/uL (ref 4.22–5.81)
RDW: 12.6 % (ref 11.5–15.5)
WBC: 3.9 10*3/uL — ABNORMAL LOW (ref 4.0–10.5)

## 2013-04-07 LAB — BASIC METABOLIC PANEL
Calcium: 9.3 mg/dL (ref 8.4–10.5)
Creatinine, Ser: 0.66 mg/dL (ref 0.50–1.35)
GFR calc non Af Amer: >90 mL/min (ref >90)
Sodium: 138 mEq/L (ref 135–145)

## 2013-04-09 ENCOUNTER — Encounter (HOSPITAL_COMMUNITY)
Admission: RE | Disposition: A | Discharge status: Home / Self Care | Payer: Self-pay | Source: Ambulatory Visit | Attending: General Surgery

## 2013-04-09 ENCOUNTER — Encounter (HOSPITAL_COMMUNITY): Payer: Self-pay | Admitting: *Deleted

## 2013-04-09 ENCOUNTER — Ambulatory Visit (HOSPITAL_COMMUNITY)
Admission: RE | Admit: 2013-04-09 | Discharge: 2013-04-09 | Disposition: A | Discharge status: Home / Self Care | Payer: BC Managed Care – PPO | Source: Ambulatory Visit | Attending: General Surgery | Admitting: General Surgery

## 2013-04-09 ENCOUNTER — Ambulatory Visit (HOSPITAL_COMMUNITY): Payer: BC Managed Care – PPO | Admitting: Anesthesiology

## 2013-04-09 ENCOUNTER — Encounter (HOSPITAL_COMMUNITY): Payer: Self-pay | Admitting: Anesthesiology

## 2013-04-09 DIAGNOSIS — Z79899 Other long term (current) drug therapy: Secondary | ICD-10-CM | POA: Insufficient documentation

## 2013-04-09 DIAGNOSIS — K449 Diaphragmatic hernia without obstruction or gangrene: Secondary | ICD-10-CM | POA: Insufficient documentation

## 2013-04-09 DIAGNOSIS — Z87891 Personal history of nicotine dependence: Secondary | ICD-10-CM | POA: Insufficient documentation

## 2013-04-09 DIAGNOSIS — K219 Gastro-esophageal reflux disease without esophagitis: Secondary | ICD-10-CM | POA: Insufficient documentation

## 2013-04-09 DIAGNOSIS — K824 Cholesterolosis of gallbladder: Secondary | ICD-10-CM | POA: Insufficient documentation

## 2013-04-09 DIAGNOSIS — K811 Chronic cholecystitis: Secondary | ICD-10-CM | POA: Insufficient documentation

## 2013-04-09 HISTORY — PX: CHOLECYSTECTOMY: SHX55

## 2013-04-09 SURGERY — LAPAROSCOPIC CHOLECYSTECTOMY
Anesthesia: General | Site: Abdomen | Wound class: Contaminated

## 2013-04-09 MED ORDER — BUPIVACAINE HCL (PF) 0.5 % IJ SOLN
INTRAMUSCULAR | Status: AC
  Filled 2013-04-09: qty 30

## 2013-04-09 MED ORDER — LIDOCAINE HCL (CARDIAC) 20 MG/ML IV SOLN
INTRAVENOUS | Status: DC | PRN
  Administered 2013-04-09: 30 mg via INTRAVENOUS

## 2013-04-09 MED ORDER — HEMOSTATIC AGENTS (NO CHARGE) OPTIME
TOPICAL | Status: DC | PRN
  Administered 2013-04-09: 1 via TOPICAL

## 2013-04-09 MED ORDER — ROCURONIUM BROMIDE 50 MG/5ML IV SOLN
INTRAVENOUS | Status: AC
  Filled 2013-04-09: qty 1

## 2013-04-09 MED ORDER — SUCCINYLCHOLINE CHLORIDE 20 MG/ML IJ SOLN
INTRAMUSCULAR | Status: AC
  Filled 2013-04-09: qty 1

## 2013-04-09 MED ORDER — ONDANSETRON HCL 4 MG/2ML IJ SOLN: 4.0000 mg | Freq: Once | INTRAMUSCULAR | Status: DC | PRN

## 2013-04-09 MED ORDER — ENOXAPARIN SODIUM 40 MG/0.4ML ~~LOC~~ SOLN
SUBCUTANEOUS | Status: AC
  Filled 2013-04-09: qty 0.4

## 2013-04-09 MED ORDER — CEFAZOLIN SODIUM-DEXTROSE 2-3 GM-% IV SOLR
INTRAVENOUS | Status: AC
  Filled 2013-04-09: qty 50

## 2013-04-09 MED ORDER — ONDANSETRON HCL 4 MG/2ML IJ SOLN
INTRAMUSCULAR | Status: AC
  Filled 2013-04-09: qty 2

## 2013-04-09 MED ORDER — FENTANYL CITRATE 0.05 MG/ML IJ SOLN
INTRAMUSCULAR | Status: DC | PRN
  Administered 2013-04-09 (×5): 50 ug via INTRAVENOUS

## 2013-04-09 MED ORDER — LIDOCAINE HCL (PF) 1 % IJ SOLN
INTRAMUSCULAR | Status: AC
  Filled 2013-04-09: qty 5

## 2013-04-09 MED ORDER — 0.9 % SODIUM CHLORIDE (POUR BTL) OPTIME
TOPICAL | Status: DC | PRN
  Administered 2013-04-09: 1000 mL

## 2013-04-09 MED ORDER — MIDAZOLAM HCL 2 MG/2ML IJ SOLN
INTRAMUSCULAR | Status: AC
  Filled 2013-04-09: qty 2

## 2013-04-09 MED ORDER — BUPIVACAINE HCL (PF) 0.5 % IJ SOLN
INTRAMUSCULAR | Status: DC | PRN
  Administered 2013-04-09: 10 mL

## 2013-04-09 MED ORDER — CHLORHEXIDINE GLUCONATE 4 % EX LIQD: 1.0000 "application " | Freq: Once | CUTANEOUS | Status: DC

## 2013-04-09 MED ORDER — NEOSTIGMINE METHYLSULFATE 1 MG/ML IJ SOLN
INTRAMUSCULAR | Status: AC
  Filled 2013-04-09: qty 1

## 2013-04-09 MED ORDER — KETOROLAC TROMETHAMINE 30 MG/ML IJ SOLN
INTRAMUSCULAR | Status: AC
  Filled 2013-04-09: qty 1

## 2013-04-09 MED ORDER — NEOSTIGMINE METHYLSULFATE 1 MG/ML IJ SOLN
INTRAMUSCULAR | Status: DC | PRN
  Administered 2013-04-09: 3 mg via INTRAVENOUS

## 2013-04-09 MED ORDER — PROPOFOL 10 MG/ML IV EMUL
INTRAVENOUS | Status: AC
  Filled 2013-04-09: qty 20

## 2013-04-09 MED ORDER — OXYCODONE-ACETAMINOPHEN 7.5-325 MG PO TABS: 1.0000 | ORAL_TABLET | ORAL | Status: DC | PRN

## 2013-04-09 MED ORDER — FENTANYL CITRATE 0.05 MG/ML IJ SOLN
INTRAMUSCULAR | Status: AC
  Filled 2013-04-09: qty 2

## 2013-04-09 MED ORDER — MIDAZOLAM HCL 2 MG/2ML IJ SOLN
1.0000 mg | INTRAMUSCULAR | Status: DC | PRN
  Administered 2013-04-09 (×2): 2 mg via INTRAVENOUS

## 2013-04-09 MED ORDER — SUCCINYLCHOLINE CHLORIDE 20 MG/ML IJ SOLN
INTRAMUSCULAR | Status: DC | PRN
  Administered 2013-04-09: 120 mg via INTRAVENOUS

## 2013-04-09 MED ORDER — FENTANYL CITRATE 0.05 MG/ML IJ SOLN
INTRAMUSCULAR | Status: AC
  Filled 2013-04-09: qty 5

## 2013-04-09 MED ORDER — FENTANYL CITRATE 0.05 MG/ML IJ SOLN
25.0000 ug | INTRAMUSCULAR | Status: DC | PRN
  Administered 2013-04-09: 50 ug via INTRAVENOUS

## 2013-04-09 MED ORDER — ONDANSETRON HCL 4 MG/2ML IJ SOLN
4.0000 mg | Freq: Once | INTRAMUSCULAR | Status: AC
  Administered 2013-04-09: 4 mg via INTRAVENOUS

## 2013-04-09 MED ORDER — KETOROLAC TROMETHAMINE 30 MG/ML IJ SOLN
30.0000 mg | Freq: Once | INTRAMUSCULAR | Status: AC
  Administered 2013-04-09: 30 mg via INTRAVENOUS

## 2013-04-09 MED ORDER — LACTATED RINGERS IV SOLN
INTRAVENOUS | Status: DC
  Administered 2013-04-09: 1000 mL via INTRAVENOUS

## 2013-04-09 MED ORDER — ROCURONIUM BROMIDE 100 MG/10ML IV SOLN
INTRAVENOUS | Status: DC | PRN
  Administered 2013-04-09: 5 mg via INTRAVENOUS
  Administered 2013-04-09: 25 mg via INTRAVENOUS

## 2013-04-09 MED ORDER — ENOXAPARIN SODIUM 40 MG/0.4ML ~~LOC~~ SOLN
40.0000 mg | Freq: Once | SUBCUTANEOUS | Status: AC
  Administered 2013-04-09: 40 mg via SUBCUTANEOUS

## 2013-04-09 MED ORDER — GLYCOPYRROLATE 0.2 MG/ML IJ SOLN
INTRAMUSCULAR | Status: DC | PRN
  Administered 2013-04-09: .6 mg via INTRAVENOUS

## 2013-04-09 MED ORDER — CEFAZOLIN SODIUM-DEXTROSE 2-3 GM-% IV SOLR
2.0000 g | INTRAVENOUS | Status: AC
  Administered 2013-04-09: 2 g via INTRAVENOUS

## 2013-04-09 MED ORDER — GLYCOPYRROLATE 0.2 MG/ML IJ SOLN
INTRAMUSCULAR | Status: AC
  Filled 2013-04-09: qty 3

## 2013-04-09 MED ORDER — PROPOFOL 10 MG/ML IV BOLUS
INTRAVENOUS | Status: DC | PRN
  Administered 2013-04-09: 175 mg via INTRAVENOUS

## 2013-04-09 SURGICAL SUPPLY — 35 items
APPLIER CLIP LAPSCP 10X32 DD (CLIP) ×2 IMPLANT
BAG HAMPER (MISCELLANEOUS) ×2 IMPLANT
BAG SPEC RTRVL LRG 6X4 10 (ENDOMECHANICALS) ×1
CLOTH BEACON ORANGE TIMEOUT ST (SAFETY) ×2 IMPLANT
COVER LIGHT HANDLE STERIS (MISCELLANEOUS) ×4 IMPLANT
DECANTER SPIKE VIAL GLASS SM (MISCELLANEOUS) ×2 IMPLANT
DURAPREP 26ML APPLICATOR (WOUND CARE) ×2 IMPLANT
ELECT REM PT RETURN 9FT ADLT (ELECTROSURGICAL) ×2
ELECTRODE REM PT RTRN 9FT ADLT (ELECTROSURGICAL) ×1 IMPLANT
FILTER SMOKE EVAC LAPAROSHD (FILTER) ×2 IMPLANT
FORMALIN 10 PREFIL 120ML (MISCELLANEOUS) ×2 IMPLANT
GLOVE BIO SURGEON STRL SZ7.5 (GLOVE) ×2 IMPLANT
GLOVE BIOGEL PI IND STRL 7.0 (GLOVE) IMPLANT
GLOVE BIOGEL PI INDICATOR 7.0 (GLOVE) ×3
GLOVE ECLIPSE 6.5 STRL STRAW (GLOVE) ×3 IMPLANT
GLOVE EXAM NITRILE MD LF STRL (GLOVE) ×1 IMPLANT
GOWN STRL REIN XL XLG (GOWN DISPOSABLE) ×7 IMPLANT
HEMOSTAT SNOW SURGICEL 2X4 (HEMOSTASIS) ×2 IMPLANT
INST SET LAPROSCOPIC AP (KITS) ×2 IMPLANT
IV NS IRRIG 3000ML ARTHROMATIC (IV SOLUTION) IMPLANT
KIT ROOM TURNOVER APOR (KITS) ×2 IMPLANT
KIT TROCAR LAP CHOLE (TROCAR) ×2 IMPLANT
MANIFOLD NEPTUNE II (INSTRUMENTS) ×2 IMPLANT
NS IRRIG 1000ML POUR BTL (IV SOLUTION) ×2 IMPLANT
PACK LAP CHOLE LZT030E (CUSTOM PROCEDURE TRAY) ×2 IMPLANT
PAD ARMBOARD 7.5X6 YLW CONV (MISCELLANEOUS) ×2 IMPLANT
POUCH SPECIMEN RETRIEVAL 10MM (ENDOMECHANICALS) ×2 IMPLANT
SET BASIN LINEN APH (SET/KITS/TRAYS/PACK) ×2 IMPLANT
SET TUBE IRRIG SUCTION NO TIP (IRRIGATION / IRRIGATOR) IMPLANT
SPONGE GAUZE 2X2 8PLY STRL LF (GAUZE/BANDAGES/DRESSINGS) ×8 IMPLANT
STAPLER VISISTAT (STAPLE) ×2 IMPLANT
SUT VICRYL 0 UR6 27IN ABS (SUTURE) ×2 IMPLANT
TAPE CLOTH SURG 4X10 WHT LF (GAUZE/BANDAGES/DRESSINGS) ×1 IMPLANT
WARMER LAPAROSCOPE (MISCELLANEOUS) ×2 IMPLANT
YANKAUER SUCT 12FT TUBE ARGYLE (SUCTIONS) ×2 IMPLANT

## 2013-04-09 NOTE — Transfer of Care (Signed)
Immediate Anesthesia Transfer of Care Note  Patient: Jack Gilbert  Procedure(s) Performed: Procedure(s): LAPAROSCOPIC CHOLECYSTECTOMY (N/A)  Patient Location: PACU  Anesthesia Type:General  Level of Consciousness: awake  Airway & Oxygen Therapy: Patient Spontanous Breathing and Patient connected to face mask oxygen  Post-op Assessment: Report given to PACU RN  Post vital signs: Reviewed and stable  Complications: No apparent anesthesia complications

## 2013-04-09 NOTE — Op Note (Signed)
Patient:  Jack Gilbert  DOB:  [birth year]  MRN:  [id number]   Preop Diagnosis:  Chronic cholecystitis, gallbladder polyp  Postop Diagnosis:  Same  Procedure:  Laparoscopic cholecystectomy  Surgeon:  Franky Macho, M.D.  Anes:  General endotracheal  Indications:  Patient is a 57 year old white male who presents with a small gallbladder polyp and chronic cholecystitis. The risks and benefits of the procedure including bleeding, infection, hepatobiliary injury, and the possibility of an open procedure were fully explained to the patient, who gave informed consent.  Procedure note:  The patient was placed in the supine position. After induction of general endotracheal anesthesia, the abdomen was prepped and draped using usual sterile technique with DuraPrep. Surgical site confirmation was performed.  A supraumbilical incision was made down to the fascia. A Veress needle was introduced into the abdominal cavity and confirmation of placement was done using the saline drop test. The abdomen was then insufflated to 16 mm mercury pressure. An 11 mm trocar was introduced into the abdominal cavity under direct visualization without difficulty. The patient was placed in reverse Trendelenburg position and additional 11 mm trocar was placed the epigastric region and 5 mm trochars were placed in the right upper quadrant and right flank regions. The liver was inspected and noted within normal limits. The gallbladder was retracted in a dynamic fashion in order to expose the triangle of Calot.  The cystic duct was first identified. Its juncture to the infundibulum was fully identified. Endoclips were placed proximally and distally on the cystic duct, and the cystic duct was divided. This is likewise done to the cystic artery. The gallbladder was then freed away from the gallbladder fossa using Bovie electrocautery. The gallbladder was delivered through the epigastric trocar site using an Endo Catch bag. The  gallbladder fossa was inspected and no abnormal bleeding or bile leakage was noted. Surgicel was placed in the gallbladder fossa. All fluid and air were then evacuated from the abdominal cavity prior to removal of the trochars.  All wounds were irrigated with normal saline. All wounds were checked with 0.5% Sensorcaine. The supraumbilical fascia as well as epigastric fascia were reapproximated using 0 Vicryl interrupted sutures. All skin incisions were closed using staples. Betadine ointment and dry sterile dressings were applied.  All tape and needle counts were correct at the end of the procedure. The patient was extubated in the operating room and transferred to PACU in stable condition.  Complications:  None  EBL:  Minimal  Specimen:  Gallbladder

## 2013-04-09 NOTE — Progress Notes (Signed)
Pt has 3 small reddened areas on lower abdomen and several on lower legs, one left hip. Pt states " removed a tick on Saturday from lower abdomen"

## 2013-04-09 NOTE — Anesthesia Procedure Notes (Addendum)
Procedure Name: Intubation Date/Time: 04/09/2013 8:47 AM Performed by: Glynn Octave E Pre-anesthesia Checklist: Patient identified, Patient being monitored, Timeout performed, Emergency Drugs available and Suction available Patient Re-evaluated:Patient Re-evaluated prior to inductionOxygen Delivery Method: Circle System Utilized Preoxygenation: Pre-oxygenation with 100% oxygen Intubation Type: IV induction, Rapid sequence and Cricoid Pressure applied Ventilation: Mask ventilation without difficulty Laryngoscope Size: Mac and 3 Grade View: Grade I Tube type: Oral Tube size: 7.0 mm Number of attempts: 1 Airway Equipment and Method: stylet Placement Confirmation: ETT inserted through vocal cords under direct vision,  positive ETCO2 and breath sounds checked- equal and bilateral Secured at: 21 cm Tube secured with: Tape Dental Injury: Teeth and Oropharynx as per pre-operative assessment

## 2013-04-09 NOTE — Anesthesia Preprocedure Evaluation (Addendum)
Anesthesia Evaluation  Patient identified by MRN, date of birth, ID band Patient awake    Reviewed: Allergy & Precautions, H&P , NPO status , Patient's Chart, lab work & pertinent test results  History of Anesthesia Complications Negative for: history of anesthetic complications  Airway Mallampati: I TM Distance: >3 FB     Dental  (+) Teeth Intact   Pulmonary former smoker,  breath sounds clear to auscultation        Cardiovascular negative cardio ROS  Rhythm:Regular Rate:Normal  Palpitations - beta blocker   Neuro/Psych    GI/Hepatic hiatal hernia, GERD-  Medicated and Controlled,  Endo/Other    Renal/GU      Musculoskeletal   Abdominal   Peds  Hematology   Anesthesia Other Findings   Reproductive/Obstetrics                           Anesthesia Physical Anesthesia Plan  ASA: II  Anesthesia Plan: General   Post-op Pain Management:    Induction: Intravenous, Rapid sequence and Cricoid pressure planned  Airway Management Planned: Oral ETT  Additional Equipment:   Intra-op Plan:   Post-operative Plan: Extubation in OR  Informed Consent: I have reviewed the patients History and Physical, chart, labs and discussed the procedure including the risks, benefits and alternatives for the proposed anesthesia with the patient or authorized representative who has indicated his/her understanding and acceptance.     Plan Discussed with:   Anesthesia Plan Comments:        Anesthesia Quick Evaluation

## 2013-04-09 NOTE — Anesthesia Postprocedure Evaluation (Signed)
  Anesthesia Post-op Note  Patient: Jack Gilbert  Procedure(s) Performed: Procedure(s): LAPAROSCOPIC CHOLECYSTECTOMY (N/A)  Patient Location: PACU  Anesthesia Type:General  Level of Consciousness: awake, alert  and oriented  Airway and Oxygen Therapy: Patient Spontanous Breathing and Patient connected to face mask oxygen  Post-op Pain: mild  Post-op Assessment: Post-op Vital signs reviewed, Patient's Cardiovascular Status Stable, Respiratory Function Stable, Patent Airway and No signs of Nausea or vomiting  Post-op Vital Signs: Reviewed and stable  Complications: No apparent anesthesia complications

## 2013-04-09 NOTE — Interval H&P Note (Signed)
History and Physical Interval Note:  04/09/2013 8:31 AM  Jack Gilbert  has presented today for surgery, with the diagnosis of cholelithiasis  The various methods of treatment have been discussed with the patient and family. After consideration of risks, benefits and other options for treatment, the patient has consented to  Procedure(s): LAPAROSCOPIC CHOLECYSTECTOMY (N/A) as a surgical intervention .  The patient's history has been reviewed, patient examined, no change in status, stable for surgery.  I have reviewed the patient's chart and labs.  Questions were answered to the patient's satisfaction.     Franky Macho A

## 2013-04-10 ENCOUNTER — Encounter (HOSPITAL_COMMUNITY): Payer: Self-pay | Admitting: General Surgery

## 2013-06-11 ENCOUNTER — Other Ambulatory Visit: Payer: Self-pay | Admitting: Family Medicine

## 2013-07-02 ENCOUNTER — Other Ambulatory Visit: Payer: Self-pay | Admitting: Family Medicine

## 2013-07-03 ENCOUNTER — Other Ambulatory Visit: Payer: Self-pay | Admitting: Family Medicine

## 2013-09-05 ENCOUNTER — Telehealth: Payer: Self-pay | Admitting: Family Medicine

## 2013-09-05 NOTE — Telephone Encounter (Signed)
The fact that both the husband and the son have "same symptoms "points strongly toward a virus as the underlying source of the illness. Most viral upper respiratory illnesses will cause head congestion drainage coughing not feeling good and usually take anywhere from 5-7 days for them to run their course. Viral illnesses that have not run their course over that span of time should be seen and evaluated further before prescribing antibiotics.

## 2013-09-05 NOTE — Telephone Encounter (Signed)
Wife Waynetta Sandy) states she was seen on 09/01/2013 for a Bacterial Infection.  States now her husband Jorja Loa has the same symptoms.  Wants to know if he should be seen or if something can be phone into the pharmacy.  Please call Patient. Thanks

## 2013-09-05 NOTE — Telephone Encounter (Signed)
Discussed that the doctor feels that The fact that both the husband and the son have "same symptoms "points strongly toward a virus as the underlying source of the illness. Most viral upper respiratory illnesses will cause head congestion drainage coughing not feeling good and usually take anywhere from 5-7 days for them to run their course. Viral illnesses that have not run their course over that span of time should be seen and evaluated further before prescribing antibiotics. Verbalized understanding and will call back if worse.

## 2013-09-05 NOTE — Telephone Encounter (Signed)
Voice mail box not set up.

## 2013-10-11 ENCOUNTER — Other Ambulatory Visit: Payer: Self-pay | Admitting: Family Medicine

## 2013-11-12 ENCOUNTER — Other Ambulatory Visit: Payer: Self-pay | Admitting: Family Medicine

## 2013-11-16 ENCOUNTER — Other Ambulatory Visit: Payer: Self-pay | Admitting: Family Medicine

## 2013-12-02 ENCOUNTER — Telehealth: Payer: Self-pay | Admitting: Family Medicine

## 2013-12-02 DIAGNOSIS — Z79899 Other long term (current) drug therapy: Secondary | ICD-10-CM

## 2013-12-02 DIAGNOSIS — Z125 Encounter for screening for malignant neoplasm of prostate: Secondary | ICD-10-CM

## 2013-12-02 DIAGNOSIS — E782 Mixed hyperlipidemia: Secondary | ICD-10-CM

## 2013-12-02 NOTE — Telephone Encounter (Signed)
Patient needs BW order

## 2013-12-02 NOTE — Telephone Encounter (Signed)
CAN WAIT

## 2013-12-02 NOTE — Telephone Encounter (Signed)
Glucose, lipid, liver, PSA. Patient should also schedule for followup visit/wellness exam

## 2013-12-02 NOTE — Telephone Encounter (Signed)
Had Liver Profile, Met 7, and CBC 04/07/13

## 2013-12-03 NOTE — Telephone Encounter (Signed)
Blood work orders placed in EPIC. Patient notified. 

## 2013-12-10 ENCOUNTER — Encounter: Payer: Self-pay | Admitting: Family Medicine

## 2013-12-10 ENCOUNTER — Ambulatory Visit (INDEPENDENT_AMBULATORY_CARE_PROVIDER_SITE_OTHER): Payer: BC Managed Care – PPO | Admitting: Family Medicine

## 2013-12-10 VITALS — BP 132/80 | Temp 99.0°F | Ht 71.0 in | Wt 186.0 lb

## 2013-12-10 DIAGNOSIS — J111 Influenza due to unidentified influenza virus with other respiratory manifestations: Secondary | ICD-10-CM

## 2013-12-10 MED ORDER — CIPROFLOXACIN HCL 500 MG PO TABS: 500.0000 mg | ORAL_TABLET | Freq: Two times a day (BID) | ORAL | Status: DC

## 2013-12-10 MED ORDER — HYDROCODONE-ACETAMINOPHEN 5-325 MG PO TABS
1.0000 | ORAL_TABLET | Freq: Four times a day (QID) | ORAL | Status: DC | PRN
Start: 2013-12-10 — End: 2013-12-24

## 2013-12-10 MED ORDER — OSELTAMIVIR PHOSPHATE 75 MG PO CAPS: 75.0000 mg | ORAL_CAPSULE | Freq: Two times a day (BID) | ORAL | Status: DC

## 2013-12-10 NOTE — Progress Notes (Signed)
   Subjective:    Patient ID: Jack Dash., male    DOB: 01-18-1956, 58 y.o.   MRN: [id number]  Fever  This is a new problem. The current episode started in the past 7 days. The maximum temperature noted was 103 to 103.9 F. Associated symptoms include congestion, coughing, headaches and muscle aches. Pertinent negatives include no chest pain, ear pain or wheezing. Associated symptoms comments: Runny nose, nausea, fatigue, .    PMH benign  Review of Systems  Constitutional: Positive for fever. Negative for activity change.  HENT: Positive for congestion and rhinorrhea. Negative for ear pain.   Eyes: Negative for discharge.  Respiratory: Positive for cough. Negative for wheezing.   Cardiovascular: Negative for chest pain.  Neurological: Positive for headaches.       Objective:   Physical Exam  Nursing note and vitals reviewed. Constitutional: He appears well-developed.  HENT:  Head: Normocephalic.  Mouth/Throat: Oropharynx is clear and moist. No oropharyngeal exudate.  Neck: Normal range of motion.  Cardiovascular: Normal rate, regular rhythm and normal heart sounds.   No murmur heard. Pulmonary/Chest: Effort normal and breath sounds normal. He has no wheezes.  Lymphadenopathy:    He has no cervical adenopathy.  Neurological: He exhibits normal muscle tone.  Skin: Skin is warm and dry.          Assessment & Plan:  Influenza-the patient was diagnosed with influenza. Patient/family educated about the flu and warning signs to watch for. If difficulty breathing, severe neck pain and stiffness, cyanosis, disorientation, or progressive worsening then immediately get rechecked at that ER. If progressive symptoms be certain to be rechecked. Supportive measures such as Tylenol/ibuprofen was discussed. No aspirin use in children. And influenza home care instruction sheet was given. Tamiflu Hydrocodone Possible sinusitis

## 2013-12-10 NOTE — Patient Instructions (Signed)

## 2013-12-20 LAB — HEPATIC FUNCTION PANEL
ALBUMIN: 4.4 g/dL (ref 3.5–5.2)
ALK PHOS: 62 U/L (ref 39–117)
ALT: 34 U/L (ref 0–53)
AST: 19 U/L (ref 0–37)
BILIRUBIN INDIRECT: 0.7 mg/dL (ref 0.0–0.9)
Bilirubin, Direct: 0.1 mg/dL (ref 0.0–0.3)
TOTAL PROTEIN: 7 g/dL (ref 6.0–8.3)
Total Bilirubin: 0.8 mg/dL (ref 0.3–1.2)

## 2013-12-20 LAB — LIPID PANEL
Cholesterol: 182 mg/dL (ref 0–200)
HDL: 39 mg/dL — ABNORMAL LOW (ref >39)
LDL CALC: 124 mg/dL — AB (ref 0–99)
TRIGLYCERIDES: 96 mg/dL (ref <150)
Total CHOL/HDL Ratio: 4.7 Ratio
VLDL: 19 mg/dL (ref 0–40)

## 2013-12-20 LAB — GLUCOSE, RANDOM: GLUCOSE: 105 mg/dL — AB (ref 70–99)

## 2013-12-22 LAB — PSA: PSA: 4.09 ng/mL — ABNORMAL HIGH (ref <=4.00)

## 2013-12-24 ENCOUNTER — Ambulatory Visit (INDEPENDENT_AMBULATORY_CARE_PROVIDER_SITE_OTHER): Payer: BC Managed Care – PPO | Admitting: Family Medicine

## 2013-12-24 ENCOUNTER — Encounter: Payer: Self-pay | Admitting: Family Medicine

## 2013-12-24 ENCOUNTER — Other Ambulatory Visit: Payer: Self-pay | Admitting: Family Medicine

## 2013-12-24 VITALS — BP 120/70 | Ht 69.0 in | Wt 178.4 lb

## 2013-12-24 DIAGNOSIS — R972 Elevated prostate specific antigen [PSA]: Secondary | ICD-10-CM

## 2013-12-24 DIAGNOSIS — R7301 Impaired fasting glucose: Secondary | ICD-10-CM

## 2013-12-24 DIAGNOSIS — Z0000 Encounter for general adult medical examination without abnormal findings: Secondary | ICD-10-CM

## 2013-12-24 LAB — POCT GLYCOSYLATED HEMOGLOBIN (HGB A1C): HEMOGLOBIN A1C: 5.2

## 2013-12-24 NOTE — Progress Notes (Signed)
Subjective:    Patient ID: Jack Gilbert., male    DOB: 1956-09-10, 58 y.o.   MRN: [id number]  HPI Patient is here today for his annual wellness exam. Patient had bloodwork done on Saturday and results are in the system. States he has no concerns at this time. States he has not gotten all his energy back from the flu he had about 2 weeks ago.   Patient patient denies any rectal bleeding denies hematuria. He does try to eat somewhat healthy he exercises some but he works a lot. The patient comes in today for a wellness visit.  A review of their health history was completed.  A review of medications was also completed. Any necessary refills were discussed. Sensible healthy diet was discussed. Importance of minimizing excessive salt and carbohydrates was also discussed. Safety was stressed including driving, activities at work and at home where applicable. Importance of regular physical activity for overall health was discussed. Preventative measures appropriate for age were discussed. Time was spent with the patient discussing any concerns they have about their well-being.  Review of Systems  Constitutional: Negative for fever, activity change and appetite change.  HENT: Negative for congestion and rhinorrhea.   Eyes: Negative for discharge.  Respiratory: Negative for cough and wheezing.   Cardiovascular: Negative for chest pain.  Gastrointestinal: Negative for vomiting, abdominal pain and blood in stool.  Genitourinary: Negative for frequency and difficulty urinating.  Musculoskeletal: Negative for neck pain.  Skin: Negative for rash.  Allergic/Immunologic: Negative for environmental allergies and food allergies.  Neurological: Negative for weakness and headaches.  Psychiatric/Behavioral: Negative for agitation.       Objective:   Physical Exam  Constitutional: He appears well-developed and well-nourished.  HENT:  Head: Normocephalic and atraumatic.  Right Ear: External  ear normal.  Left Ear: External ear normal.  Nose: Nose normal.  Mouth/Throat: Oropharynx is clear and moist.  Eyes: EOM are normal. Pupils are equal, round, and reactive to light.  Neck: Normal range of motion. Neck supple. No thyromegaly present.  Cardiovascular: Normal rate, regular rhythm and normal heart sounds.   No murmur heard. Pulmonary/Chest: Effort normal and breath sounds normal. No respiratory distress. He has no wheezes.  Abdominal: Soft. Bowel sounds are normal. He exhibits no distension and no mass. There is no tenderness.  Genitourinary: Penis normal.  Musculoskeletal: Normal range of motion. He exhibits no edema.  Lymphadenopathy:    He has no cervical adenopathy.  Neurological: He is alert. He exhibits normal muscle tone.  Skin: Skin is warm and dry. No erythema.  Psychiatric: He has a normal mood and affect. His behavior is normal. Judgment normal.   Prostate exam normal no nodules felt       Assessment & Plan:  Elevated PSA-patient very worried about this. We will go ahead with PSA and free PSA. More than likely will also need referral to urology depending on the results of this. His prostate exam felt normal today but this does not exclude the possibility of cancer. Patient was told that this blood test as a screening test and is not diagnostic. He was also told he may need a prostate ultrasound and needle biopsies.  hypergly-he had fasting hyperglycemia but his hemoglobin A1c looks normal I. encourage patient to follow a heart healthy diet to minimize carbohydrates  hyperlip-mild hyperlipidemia he needs to watch his diet closely exercise on regular basis recheck this again in 6 months he may need medication if it continues to go  up  Recheck 6 months on labs/followup on PSA as discussed above  Safety measures dietary measures discussed. Wellness exam completed.

## 2013-12-25 LAB — PSA, TOTAL AND FREE
PSA FREE PCT: 17 % — AB (ref >25)
PSA FREE: 0.71 ng/mL
PSA: 4.18 ng/mL — AB (ref <=4.00)

## 2013-12-25 NOTE — Addendum Note (Signed)
Addended by: Sallee Lange A on: 12/25/2013 08:01 AM   Modules accepted: Orders

## 2013-12-26 ENCOUNTER — Other Ambulatory Visit: Payer: Self-pay | Admitting: *Deleted

## 2013-12-26 ENCOUNTER — Telehealth: Payer: Self-pay | Admitting: Family Medicine

## 2013-12-26 MED ORDER — DOXYCYCLINE HYCLATE 100 MG PO TABS
100.0000 mg | ORAL_TABLET | Freq: Two times a day (BID) | ORAL | Status: DC
Start: 2013-12-26 — End: 2014-01-17

## 2013-12-26 NOTE — Telephone Encounter (Signed)
Please call at [phone]

## 2013-12-26 NOTE — Telephone Encounter (Signed)
Patient is going to call back around 3:30 or 4 to see if his lab results have been signed off on yet.

## 2013-12-26 NOTE — Telephone Encounter (Signed)
Jack Gilbert, I keep getting messages about this pt, but his doc is scott. Blood work was sent erroneously to me, I forwarded to [employer]. Scott saw two days ago and discussed all the results with him

## 2013-12-26 NOTE — Telephone Encounter (Signed)
Dr. Scott spoke with patient 

## 2014-01-15 ENCOUNTER — Ambulatory Visit (INDEPENDENT_AMBULATORY_CARE_PROVIDER_SITE_OTHER): Payer: BC Managed Care – PPO | Admitting: Family Medicine

## 2014-01-15 ENCOUNTER — Encounter: Payer: Self-pay | Admitting: Family Medicine

## 2014-01-15 VITALS — BP 120/88 | Temp 98.0°F | Ht 69.0 in | Wt 174.5 lb

## 2014-01-15 DIAGNOSIS — F411 Generalized anxiety disorder: Secondary | ICD-10-CM

## 2014-01-15 DIAGNOSIS — R109 Unspecified abdominal pain: Secondary | ICD-10-CM

## 2014-01-15 MED ORDER — CITALOPRAM HYDROBROMIDE 10 MG PO TABS: 10.0000 mg | ORAL_TABLET | Freq: Every day | ORAL | Status: DC

## 2014-01-15 NOTE — Progress Notes (Signed)
   Subjective:    Patient ID: Jack Dash., male    DOB: Nov 15, 1956, 58 y.o.   MRN: [id number]  Constipation This is a new problem. The current episode started 1 to 4 weeks ago. The problem is unchanged. The stool is described as firm. Risk factors include stress. He has tried stool softeners for the symptoms. The treatment provided mild relief.  Patient states that he saw the specialist for his elevated PSA and would like to review this with the doctor also.  Patient states he gets exceptionally worried about his elevated PSA and worried about his abdominal pain and discomfort he states in addition to this he will often get on the Internet and read about various medical aspects which in turn caused him to be very stressed out he denies being depressed   Review of Systems  Gastrointestinal: Positive for constipation.   patient denies vomiting diarrhea denies reflux denies fever chills does not wake him up at nighttime denies cough shortness of breath     Objective:   Physical Exam  Lungs are clear hearts regular pulse normal blood pressure good Abdomen is soft there is mild tenderness in the epigastrium region no guarding or rebound no masses are felt extremities no edema      Assessment & Plan:  #1 elevated PSA following through with specialist repeating it in 3 months I believe this is reasonable  #2 abdominal discomforts-patient has continued trouble with this he may need ultrasound along with lab work. But currently I think this is related to him being very stressed. I recommend Celexa 10 mg daily and patient will let us know if not doing better in the next couple weeks otherwise followup in 3 weeks and at that time if still having symptoms we will do lab work and ultrasound. Patient was told if he starts having pain that wakes him up at night increasing pain vomiting or bloody stools he needs to contact us sooner to be seen right away  25-30 minutes spent with patient

## 2014-01-17 ENCOUNTER — Encounter (HOSPITAL_COMMUNITY): Payer: Self-pay | Admitting: Emergency Medicine

## 2014-01-17 ENCOUNTER — Emergency Department (HOSPITAL_COMMUNITY): Payer: BC Managed Care – PPO

## 2014-01-17 ENCOUNTER — Emergency Department (HOSPITAL_COMMUNITY)
Admission: EM | Admit: 2014-01-17 | Discharge: 2014-01-17 | Disposition: A | Discharge status: Home / Self Care | Payer: BC Managed Care – PPO | Attending: Emergency Medicine | Admitting: Emergency Medicine

## 2014-01-17 DIAGNOSIS — R1013 Epigastric pain: Secondary | ICD-10-CM | POA: Insufficient documentation

## 2014-01-17 DIAGNOSIS — Z9889 Other specified postprocedural states: Secondary | ICD-10-CM | POA: Insufficient documentation

## 2014-01-17 DIAGNOSIS — K5900 Constipation, unspecified: Secondary | ICD-10-CM | POA: Insufficient documentation

## 2014-01-17 DIAGNOSIS — R109 Unspecified abdominal pain: Secondary | ICD-10-CM

## 2014-01-17 DIAGNOSIS — E785 Hyperlipidemia, unspecified: Secondary | ICD-10-CM | POA: Insufficient documentation

## 2014-01-17 DIAGNOSIS — Z792 Long term (current) use of antibiotics: Secondary | ICD-10-CM | POA: Insufficient documentation

## 2014-01-17 DIAGNOSIS — Z87891 Personal history of nicotine dependence: Secondary | ICD-10-CM | POA: Insufficient documentation

## 2014-01-17 DIAGNOSIS — F419 Anxiety disorder, unspecified: Secondary | ICD-10-CM

## 2014-01-17 DIAGNOSIS — F411 Generalized anxiety disorder: Secondary | ICD-10-CM | POA: Insufficient documentation

## 2014-01-17 DIAGNOSIS — Z9089 Acquired absence of other organs: Secondary | ICD-10-CM | POA: Insufficient documentation

## 2014-01-17 DIAGNOSIS — R1012 Left upper quadrant pain: Secondary | ICD-10-CM | POA: Insufficient documentation

## 2014-01-17 DIAGNOSIS — Z87448 Personal history of other diseases of urinary system: Secondary | ICD-10-CM | POA: Insufficient documentation

## 2014-01-17 DIAGNOSIS — G8929 Other chronic pain: Secondary | ICD-10-CM | POA: Insufficient documentation

## 2014-01-17 DIAGNOSIS — Z79899 Other long term (current) drug therapy: Secondary | ICD-10-CM | POA: Insufficient documentation

## 2014-01-17 HISTORY — DX: Dorsalgia, unspecified: M54.9

## 2014-01-17 HISTORY — DX: Anxiety disorder, unspecified: F41.9

## 2014-01-17 HISTORY — DX: Other chronic pain: G89.29

## 2014-01-17 LAB — CBC WITH DIFFERENTIAL/PLATELET
BASOS PCT: 1 % (ref 0–1)
Basophils Absolute: 0.1 10*3/uL (ref 0.0–0.1)
EOS ABS: 0.3 10*3/uL (ref 0.0–0.7)
EOS PCT: 6 % — AB (ref 0–5)
HEMATOCRIT: 42.6 % (ref 39.0–52.0)
HEMOGLOBIN: 15 g/dL (ref 13.0–17.0)
Lymphocytes Relative: 27 % (ref 12–46)
Lymphs Abs: 1.6 10*3/uL (ref 0.7–4.0)
MCH: 30.6 pg (ref 26.0–34.0)
MCHC: 35.2 g/dL (ref 30.0–36.0)
MCV: 86.9 fL (ref 78.0–100.0)
MONO ABS: 0.5 10*3/uL (ref 0.1–1.0)
MONOS PCT: 9 % (ref 3–12)
Neutro Abs: 3.5 10*3/uL (ref 1.7–7.7)
Neutrophils Relative %: 58 % (ref 43–77)
Platelets: 269 10*3/uL (ref 150–400)
RBC: 4.9 MIL/uL (ref 4.22–5.81)
RDW: 12.7 % (ref 11.5–15.5)
WBC: 6 10*3/uL (ref 4.0–10.5)

## 2014-01-17 LAB — URINALYSIS, ROUTINE W REFLEX MICROSCOPIC
Bilirubin Urine: NEGATIVE
Glucose, UA: NEGATIVE mg/dL
Hgb urine dipstick: NEGATIVE
Ketones, ur: NEGATIVE mg/dL
Leukocytes, UA: NEGATIVE
Nitrite: NEGATIVE
PH: 5.5 (ref 5.0–8.0)
Protein, ur: NEGATIVE mg/dL
Specific Gravity, Urine: >1.03 — ABNORMAL HIGH (ref 1.005–1.030)
Urobilinogen, UA: 0.2 mg/dL (ref 0.0–1.0)

## 2014-01-17 LAB — COMPREHENSIVE METABOLIC PANEL
ALBUMIN: 4.7 g/dL (ref 3.5–5.2)
ALT: 18 U/L (ref 0–53)
AST: 15 U/L (ref 0–37)
Alkaline Phosphatase: 79 U/L (ref 39–117)
BUN: 11 mg/dL (ref 6–23)
CO2: 28 mEq/L (ref 19–32)
CREATININE: 0.73 mg/dL (ref 0.50–1.35)
Calcium: 9.9 mg/dL (ref 8.4–10.5)
Chloride: 98 mEq/L (ref 96–112)
GFR calc Af Amer: >90 mL/min (ref >90)
GFR calc non Af Amer: >90 mL/min (ref >90)
Glucose, Bld: 103 mg/dL — ABNORMAL HIGH (ref 70–99)
Potassium: 3.8 mEq/L (ref 3.7–5.3)
Sodium: 139 mEq/L (ref 137–147)
Total Bilirubin: 0.5 mg/dL (ref 0.3–1.2)
Total Protein: 7.9 g/dL (ref 6.0–8.3)

## 2014-01-17 LAB — LIPASE, BLOOD: LIPASE: 44 U/L (ref 11–59)

## 2014-01-17 MED ORDER — DICYCLOMINE HCL 10 MG/ML IM SOLN
20.0000 mg | Freq: Once | INTRAMUSCULAR | Status: AC
  Administered 2014-01-17: 20 mg via INTRAMUSCULAR
  Filled 2014-01-17: qty 2

## 2014-01-17 MED ORDER — SODIUM CHLORIDE 0.9 % IJ SOLN
INTRAMUSCULAR | Status: AC
  Filled 2014-01-17: qty 250

## 2014-01-17 MED ORDER — IOHEXOL 300 MG/ML  SOLN
50.0000 mL | Freq: Once | INTRAMUSCULAR | Status: AC | PRN
  Administered 2014-01-17: 50 mL via ORAL

## 2014-01-17 MED ORDER — SODIUM CHLORIDE 0.9 % IV SOLN
INTRAVENOUS | Status: DC
  Administered 2014-01-17: 20:00:00 via INTRAVENOUS

## 2014-01-17 MED ORDER — IOHEXOL 300 MG/ML  SOLN
100.0000 mL | Freq: Once | INTRAMUSCULAR | Status: AC | PRN
  Administered 2014-01-17: 100 mL via INTRAVENOUS

## 2014-01-17 MED ORDER — FAMOTIDINE IN NACL 20-0.9 MG/50ML-% IV SOLN
20.0000 mg | Freq: Once | INTRAVENOUS | Status: AC
  Administered 2014-01-17: 20 mg via INTRAVENOUS
  Filled 2014-01-17: qty 50

## 2014-01-17 NOTE — Discharge Instructions (Signed)
°Emergency Department Resource Guide °1) Find a Doctor and Pay Out of Pocket °Although you won't have to find out who is covered by your insurance plan, it is a good idea to ask around and get recommendations. You will then need to call the office and see if the doctor you have chosen will accept you as a new patient and what types of options they offer for patients who are self-pay. Some doctors offer discounts or will set up payment plans for their patients who do not have insurance, but you will need to ask so you aren't surprised when you get to your appointment. ° °2) Contact Your Local Health Department °Not all health departments have doctors that can see patients for sick visits, but many do, so it is worth a call to see if yours does. If you don't know where your local health department is, you can check in your phone book. The CDC also has a tool to help you locate your state's health department, and many state websites also have listings of all of their local health departments. ° °3) Find a Walk-in Clinic °If your illness is not likely to be very severe or complicated, you may want to try a walk in clinic. These are popping up all over the country in pharmacies, drugstores, and shopping centers. They're usually staffed by nurse practitioners or physician assistants that have been trained to treat common illnesses and complaints. They're usually fairly quick and inexpensive. However, if you have serious medical issues or chronic medical problems, these are probably not your best option. ° °No Primary Care Doctor: °- Call [facility] at  832-8000 - they can help you locate a primary care doctor that  accepts your insurance, provides certain services, etc. °- Physician Referral Service- 1-800-533-3463 ° °Chronic Pain Problems: °Organization         Address  Phone   Notes  °[facility]  (336) [phone] Patients need to be referred by their primary care doctor.  ° °Medication  Assistance: °Organization         Address  Phone   Notes  °[place] [facility] [address] °[place], [place] [address] (336) 641-8030 --Must be a resident of [place] °-- Must have NO insurance coverage whatsoever (no Medicaid/ Medicare, etc.) °-- The pt. MUST have a primary care doctor that directs their care regularly and follows them in the community °  °[facility]  (866) 331-1348   °[employer]  (888) 892-1162   ° °Agencies that provide inexpensive medical care: °Organization         Address  Phone   Notes  °[facility]  (336) 832-8035   °[facility]    (336) 832-7272   °[facility] [address] °[place], [place] [address] (336) 832-4777   °[facility] [address], °[place] (336) 271-4999   °[facility]    (336) 373-0678   °[facility]    (336) 272-1050   °[facility] ° [address], [place] Phone:  (336) 832-4444, Fax:  (336) 832-4440 Hours of Operation:  9 am - 6 pm, M-F.  Also accepts Medicaid/Medicare and self-pay.  °[facility] ° [address], [place] Phone: (336) [phone], Fax: (336) [phone]. Hours of Operation:  8:30 am - 5:30 pm, M-F.  Also accepts Medicaid and self-pay.  °[facility] [place] [address], [place] Phone: (336) 878-6027   °[facility] [address], [place], [place] (336)723-1848, Ext. 123 Mondays & Thursdays: 7-9 AM.  First 15 patients are seen on a first come, first serve basis. °  ° °Medicaid-accepting [place] Providers: ° °Organization         Address  Phone   Notes  °[facility] [address], [place] (336) 641-2100 Also accepts self-pay patients.  °[facility] [address], [place] ° (336) 856-9996   °[facility] [address] [place] [address], [place]  (336) 288-8857   °[facility] [address] [place] [address], [place] (336) 299-7000   °Veita Bland [address], [place]  ° (336) 373-1557 Only accepts [place] Access Medicaid patients after they have their name applied to their card.  ° °Self-Pay (no insurance) in [place]: ° °Organization         Address  Phone   Notes  °Sickle Cell Patients, [facility] [address], [place] (336) 832-1970   °[facility] [address], [place] (336) 832-4400   °[facility] [place] ° [address], [place] (336) 992-4800   °[facility]/Dr. Osei-Bonsu ° [address] [place] [address], [place] or [address], [place] (336) [phone] Phone number for both [place] and [place] locations is the same.  °[facility] [address], [place] (336) 299-0000   °[facility] [address] [place] [address], [place] or [address] (336) 852-7530 °(336) 878-2260   °[facility] [address], [place] (336) 350-1642, phone; (336) 294-5005, fax Sees patients 1st and 3rd Saturday of every month.  Must not qualify for public or private insurance (i.e. Medicaid, Medicare, [place] Health Choice, Veterans' Benefits) • Household income should be no more than 200% of the poverty level •The clinic cannot treat you if you are pregnant or think you are pregnant • Sexually transmitted diseases are not treated at the clinic.  ° ° °Dental Care: °Organization         Address  Phone  Notes  °[place] [facility] [address], [place] (336) 641-6152 Accepts children up to age 21 who are enrolled in Medicaid or [place] Health Choice; pregnant women with a Medicaid card; and children who have applied for Medicaid or [place] Health Choice, but were declined, whose parents can pay a reduced fee at time of service.  °[place]  [facility] [place]  [address], [place] (336) 641-7733 Accepts children up to age 21 who are enrolled in Medicaid or [place] Health Choice; pregnant women with a Medicaid card; and children who have applied for Medicaid or  Health Choice, but were declined, whose parents can pay a reduced fee at time of service.  °[facility] ° [address],  (336) 641-4533 Patients are seen by appointment only. Walk-ins are not accepted. [facility] will see patients 18 years of age and older. °Monday - Tuesday (8am-5pm) °Most Wednesdays (8:30-5pm) °$30 per visit, cash only  °[facility] ° [address], [place] (336) 641-4533 Patients are seen by appointment only. Walk-ins are not accepted. [facility] will see patients 18 years of age and older. °One   Wednesday Evening (Monthly: Volunteer Based).  $30 per visit, cash only  °[facility]  (919) 537-3737 for adults; Children under age 4, call Graduate Pediatric Dentistry at ([phone]) [phone]. Children aged 4-14, please call ([phone]) [phone] to request a pediatric application. ° Dental services are provided in all areas of dental care including fillings, crowns and bridges, complete and partial dentures, implants, gum treatment, root canals, and extractions. Preventive care is also provided. Treatment is provided to both adults and children. °Patients are selected via a lottery and there is often a waiting list. °  °[facility] [address], °[place] ° (336) 763-8833 [email or web address] °  °[facility] [address], [place], [place] (336)723-1848, Ext. 123 Second and Fourth Thursday of each month, opens at 6:30 AM; Clinic ends at 9 AM.  Patients are seen on a first-come first-served basis, and a limited number are seen during each clinic.  ° °[facility] ° [address], [place], [place] (336) [phone]    Eligibility Requirements °You must have lived in [place], [place], or [place] counties for at least the last three months. °  You cannot be eligible for state or federal sponsored healthcare insurance, including Veterans Administration, Medicaid, or Medicare. °  You generally cannot be eligible for healthcare insurance through your employer.  °  How to apply: °Eligibility screenings are held every Tuesday and Wednesday afternoon from 1:00 pm until 4:00 pm. You do not need an appointment for the interview!  °[facility] [address], [place], [place] 336-631-2330   °[facility]  336-342-8273   °[facility]  336-703-3100   °[facility]  336-570-6415   ° °Behavioral Health Resources in the Community: °Intensive Outpatient Programs °Organization         Address  Phone  Notes  °[facility] [address], [place], [place] 336-878-6098   °[facility] [address], [place] , [place] 336-832-9800   °[facility] [address], [place], [place] ° 336-882-2125   °[facility] [address],  °[place], [place] 1-800-853-5163 or 336-641-4981   °Substance Abuse Resources °Organization         Address  Phone  Notes  °[facility]  336-882-2125   °[facility]  336-784-9470   °[facility]  336-285-9073   °[facility]  336-845-3988   °Residential & Outpatient Substance Abuse Program  1-800-659-3381   °Psychological Services °Organization         Address  Phone  Notes  ° Health  336- 832-9600   °[facility]  336- 378-7881   °[facility] [address], [place] 1-800-853-5163 or 336-641-4981   ° °Mobile Crisis Teams °Organization         Address  Phone  Notes  °[facility], Mobile Crisis Care Unit  1-877-626-1772   °Assertive °[facility] ° [address]  [place], [place] 336-834-9664   °Sharon DeEsch [address] °[place] 336-554-5454   ° °Self-Help/Support Groups °Organization         Address  Phone             Notes  °[facility] - variety of support groups  336- 373-1402 Call for more information  °Narcotics Anonymous (NA), [facility] [address], °[place]  2 meetings at this location  ° °  Residential Treatment Programs Organization         Address  Phone  Notes  [facility] [address],    [place]  1-(980)729-3404   [facility]  [address] [place] [address], [place], [place]   [place], [place] [phone] Admissions: 8am-3pm M-F  [facility] [place] [address]    [place], [place] 702-637-8588   [facility] [address] [place], [place], [place]   [facility] [address],  [place], [place]   [facility] Intensive Outpatient [address], [place], [place]   [facility]  [place], [place] 1-231-596-0269 or 607-821-0604   [facility] [address] [place], [place] Accepts Medicaid  [facility] [address]  [place] 1-515-118-7333 Substance Abuse/Addiction Treatment   [facility]         Address  Phone  Notes  [facility]  (661) 464-7447   Domenic Schwab, PhD [address] Arlis Porta [place], [place]   9848613029 or (367) [phone]   [place], [place] [phone]   [facility] [address] [place], [place] [phone] Insurance/Medicaid/sponsorship through [facility] [address]                                    [place], [place] [phone] [place] [address], [place] [phone]    Dr. Adele Schilder  [phone]   [facility] Dept. 1) [address], [place] 2) [place] 3)  [address], [place] [phone]) [phone]  804-119-1992   [place] 757-683-1285 or (774)[phone] (After Hours)       Eat a bland diet, avoiding greasy, fatty, fried foods, as well as spicy and acidic foods or beverages.  Avoid eating within the hour or 2 before going to bed or laying down.  Also avoid teas, colas, coffee, chocolate, pepermint and spearment.  Take over the counter pepcid, one tablet by mouth twice a day, for the next 2 to 3 weeks.  May also take over the counter maalox/mylanta, as directed on packaging, as needed for discomfort.  Call your regular medical doctor and your GI doctor on Monday to schedule a follow up appointment this week.  Return to the Emergency Department immediately if worsening.

## 2014-01-17 NOTE — ED Notes (Signed)
Patient complaining of diffuse abdominal pain and lower back pain x 4 days.
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
## 2014-01-19 LAB — URINE CULTURE
COLONY COUNT: NO GROWTH
CULTURE: NO GROWTH

## 2014-02-11 ENCOUNTER — Encounter: Payer: Self-pay | Admitting: Family Medicine

## 2014-02-11 ENCOUNTER — Ambulatory Visit (INDEPENDENT_AMBULATORY_CARE_PROVIDER_SITE_OTHER): Payer: BC Managed Care – PPO | Admitting: Family Medicine

## 2014-02-11 ENCOUNTER — Ambulatory Visit (HOSPITAL_COMMUNITY)
Admission: RE | Admit: 2014-02-11 | Discharge: 2014-02-11 | Disposition: A | Discharge status: Home / Self Care | Payer: BC Managed Care – PPO | Source: Ambulatory Visit | Attending: Family Medicine | Admitting: Family Medicine

## 2014-02-11 VITALS — BP 134/80 | Ht 69.0 in | Wt 178.0 lb

## 2014-02-11 DIAGNOSIS — M545 Low back pain, unspecified: Secondary | ICD-10-CM

## 2014-02-11 DIAGNOSIS — M47817 Spondylosis without myelopathy or radiculopathy, lumbosacral region: Secondary | ICD-10-CM | POA: Insufficient documentation

## 2014-02-11 DIAGNOSIS — F411 Generalized anxiety disorder: Secondary | ICD-10-CM

## 2014-02-11 DIAGNOSIS — R972 Elevated prostate specific antigen [PSA]: Secondary | ICD-10-CM

## 2014-02-11 MED ORDER — CHLORZOXAZONE 500 MG PO TABS: 500.0000 mg | ORAL_TABLET | Freq: Three times a day (TID) | ORAL | Status: DC | PRN

## 2014-02-11 NOTE — Progress Notes (Signed)
   Subjective:    Patient ID: Jack Dash., male    DOB: 10/01/1956, 58 y.o.   MRN: [id number]  Anxiety Presents for follow-up visit. Symptoms occur occasionally. Current severity: none. The quality of sleep is fair.   Compliance with medications is 76-100%.  Back Pain This is a new problem. The current episode started 1 to 4 weeks ago. The problem occurs intermittently. The problem is unchanged. The quality of the pain is described as aching. The pain radiates to the right thigh and left thigh. The pain is moderate. The pain is the same all the time. Stiffness is present all day. He has tried nothing for the symptoms. The treatment provided no relief.   He relates his anxiety issues are actually doing much better.  His back pain radiates into both legs worse with certain movements and sitting up and turning.   Review of Systems  Musculoskeletal: Positive for back pain.   He denies loss of bowel or bladder control denies nausea vomiting diarrhea. Denies fever    Objective:   Physical Exam  His lungs clear hearts were regular subjective discomfort in the low back negative straight leg raise strength is good      Assessment & Plan:  Lumbar pain in the face of having a slightly elevated PSA it is wise to go ahead and do x-rays. I really doubt it is due to his prostate I believe this is a ligament strain I showed him some court exercises he can do he did use muscle relaxer needing time not when he is working. In addition to this if he has progressive symptoms may need MRI.  He is to followup in 6 weeks' time if still having significant back pain and radiation I would recommend MRI

## 2014-02-18 ENCOUNTER — Telehealth: Payer: Self-pay | Admitting: Family Medicine

## 2014-02-18 NOTE — Telephone Encounter (Signed)
Patient is still having problems with his leg muscles. He isn't sure if it is related to another problem he has going on. Would like Dr Nicki Reaper to call him.

## 2014-02-19 NOTE — Telephone Encounter (Signed)
Patient was recently seen for back strain he also has history of prostate elevated PSA and history in the past of prostatitis please call patient try to inquire as best he can what is going on I will be back in tomorrow to reassess. If he needs to be seen we can see him on Friday

## 2014-02-19 NOTE — Telephone Encounter (Signed)
Voice mail box not set up yet

## 2014-02-20 MED ORDER — SILODOSIN 8 MG PO CAPS: 8.0000 mg | ORAL_CAPSULE | Freq: Every day | ORAL | Status: DC

## 2014-02-20 MED ORDER — CIPROFLOXACIN HCL 500 MG PO TABS: 500.0000 mg | ORAL_TABLET | Freq: Two times a day (BID) | ORAL | Status: DC

## 2014-02-20 NOTE — Telephone Encounter (Signed)
Patient stated he is having nerve pain in both legs. Muscle relaxer not helping. Also having low back pain. No fever, No more trouble urinating than ususal. He sees [facility] but has been out of his rapaflo. Want to know if he can get refill. Advised patient if he was having trouble with his prostate he may need to be seen today. Patient stated he is at work and cannot come in today.

## 2014-02-20 NOTE — Telephone Encounter (Signed)
meds sent to pharm. Tried to call. Voicemail not set up.

## 2014-02-20 NOTE — Telephone Encounter (Signed)
Pt notifed to follow up if leg pain continues, rapaflo and cipro called into pharm.

## 2014-02-20 NOTE — Telephone Encounter (Signed)
Tried to call no answer. Voicemail not set up.  

## 2014-02-20 NOTE — Telephone Encounter (Signed)
Refill rapaflo 4 refills, also cipro 500 one bid 3 weeks ( #42) , follow up if ongoing leg pain over next 2 to 3 weeks

## 2014-02-23 ENCOUNTER — Telehealth: Payer: Self-pay | Admitting: Family Medicine

## 2014-02-23 NOTE — Telephone Encounter (Signed)
Received prior auth request from [facility] for pt's RAPAFLO.  We did not start pt on this medicine, we only refilled it because pt stated he normally sees [facility] and he was out of the medication.  We have no documentation of diagnosis for medication and no documentation of "tried and failed" alternatives with dates of therapy.  Unable to complete form with information we have.  Please advise.

## 2014-02-23 NOTE — Telephone Encounter (Signed)
For pt to stay with this 1)- inform pt that specialist will need to do the Prior Auth, if he switches to flomax we can do that (its generic) call pt [facility] get Rap[facility]flo through their getting prior author. 2) send any further message to Dr Richardson Landry this week if urgent. I am answering by remote and will not be available all days

## 2014-03-02 ENCOUNTER — Other Ambulatory Visit: Payer: Self-pay | Admitting: *Deleted

## 2014-03-02 MED ORDER — TAMSULOSIN HCL 0.4 MG PO CAPS: 0.4000 mg | ORAL_CAPSULE | Freq: Every day | ORAL | Status: DC

## 2014-03-02 NOTE — Telephone Encounter (Signed)
flomax sent to pharm. rapaflo d/c. Pt notified.

## 2014-03-02 NOTE — Telephone Encounter (Signed)
Spoke with patient, he would like to try the generic FLOMAX, please send in to [facility] and call pt when done (cell# [phone])

## 2014-03-02 NOTE — Telephone Encounter (Signed)
Please make a change in his medication in the electronic record. Stop Rapaflo, instead use Flomax 0.4 mg one daily #30, 6 refills. Notify patient via cell number.

## 2014-03-25 ENCOUNTER — Telehealth: Payer: Self-pay | Admitting: Family Medicine

## 2014-03-25 MED ORDER — CIPROFLOXACIN HCL 500 MG PO TABS: 500.0000 mg | ORAL_TABLET | Freq: Two times a day (BID) | ORAL | Status: DC

## 2014-03-25 NOTE — Telephone Encounter (Signed)
cipro 500, #42 , 1 bid  3 weeks, f/u OV if ongoing

## 2014-03-25 NOTE — Telephone Encounter (Signed)
Seen 3/15.

## 2014-03-25 NOTE — Telephone Encounter (Signed)
Difficulties with his prostate, can we call him in some more  ciprofloxacin (CIPRO) 500 MG tablet      [facility]

## 2014-04-14 ENCOUNTER — Other Ambulatory Visit: Payer: Self-pay | Admitting: Family Medicine

## 2014-07-13 ENCOUNTER — Other Ambulatory Visit: Payer: Self-pay | Admitting: Family Medicine

## 2014-07-17 ENCOUNTER — Other Ambulatory Visit: Payer: Self-pay | Admitting: *Deleted

## 2014-07-17 MED ORDER — CITALOPRAM HYDROBROMIDE 10 MG PO TABS: 10.0000 mg | ORAL_TABLET | Freq: Every day | ORAL | Status: DC

## 2014-11-11 ENCOUNTER — Other Ambulatory Visit: Payer: Self-pay | Admitting: Family Medicine

## 2014-11-26 ENCOUNTER — Other Ambulatory Visit: Payer: Self-pay | Admitting: Family Medicine

## 2014-12-25 ENCOUNTER — Ambulatory Visit (INDEPENDENT_AMBULATORY_CARE_PROVIDER_SITE_OTHER): Payer: BLUE CROSS/BLUE SHIELD | Admitting: Family Medicine

## 2014-12-25 ENCOUNTER — Encounter: Payer: Self-pay | Admitting: Family Medicine

## 2014-12-25 VITALS — BP 138/84 | Temp 97.8°F | Ht 69.0 in | Wt 191.0 lb

## 2014-12-25 DIAGNOSIS — N410 Acute prostatitis: Secondary | ICD-10-CM

## 2014-12-25 DIAGNOSIS — E785 Hyperlipidemia, unspecified: Secondary | ICD-10-CM

## 2014-12-25 DIAGNOSIS — R350 Frequency of micturition: Secondary | ICD-10-CM

## 2014-12-25 LAB — POCT URINALYSIS DIPSTICK
Spec Grav, UA: <=1.005
pH, UA: 6

## 2014-12-25 MED ORDER — CITALOPRAM HYDROBROMIDE 10 MG PO TABS: 10.0000 mg | ORAL_TABLET | Freq: Every day | ORAL | Status: DC

## 2014-12-25 MED ORDER — CIPROFLOXACIN HCL 500 MG PO TABS: 500.0000 mg | ORAL_TABLET | Freq: Two times a day (BID) | ORAL | Status: DC

## 2014-12-25 MED ORDER — METOPROLOL SUCCINATE ER 200 MG PO TB24: ORAL_TABLET | ORAL | Status: DC

## 2014-12-25 NOTE — Progress Notes (Signed)
   Subjective:    Patient ID: Jack Gilbert., male    DOB: 12-25-1955, 59 y.o.   MRN: [id number]  HPIHere for med check up. Elevated PSA. Sees specialist.  Having urinary frequency and burning for the past 2 - 3 weeks.  Runny nose - clear, sneezing, and itchy eyes for the past 2 days This patient has history of elevated PSA he had several questions regarding this the specialists tried prescribe Cialis but he could not afford it he is thinking about going back on Proscar. In addition to this patient has had some element of head cold recently. And also having some prostatitis symptoms of slight dysuria or urinary frequency incident decreased flow. Patient is up-to-date on colonoscopy. Review of Systems Patient relates some urinary for disease slight dysuria denies high fever chills denies chest pressure tightness shortness breath nausea vomiting diarrhea.    Objective:   Physical Exam Neck no masses lungs are clear no crackles heart is regular pulse normal abdomen soft no guarding rebound extremities no edema blood pressure is good prostate exam enlarged and tender       Assessment & Plan:  Palpitations under good control currently refills given Stress related issues occasional anxiety doing well on low-dose Celexa refills given Follow-up again in 6 months  Acute prostatitis antibiotics prescribed Patient was encouraged to get blood work to look at his cholesterol kidney functions and sugar He was encouraged healthy diet regular physical activity and a follow-up again in 6 months He will follow-up with [facility] as previously stated in their note. They like to see him every 6 months to follow his PSA. This patient has had elevated PSA. Is not been able to afford Cialis 5 mg daily I told him to get a letter from the urologist stating that this was for BPH and not for erectile dysfunction.

## 2015-03-21 IMAGING — CR DG CHEST 2V
2 series · 2 of 2 positions shown · non-contrast
Comparison: Two-view chest x-ray 04/18/2010, portable chest x-ray
02/14/2007.

CLINICAL DATA: Abdominal pain.  Current history of hypertension.

EXAM:
CHEST  2 VIEW

[view not recorded (1 of 2)]
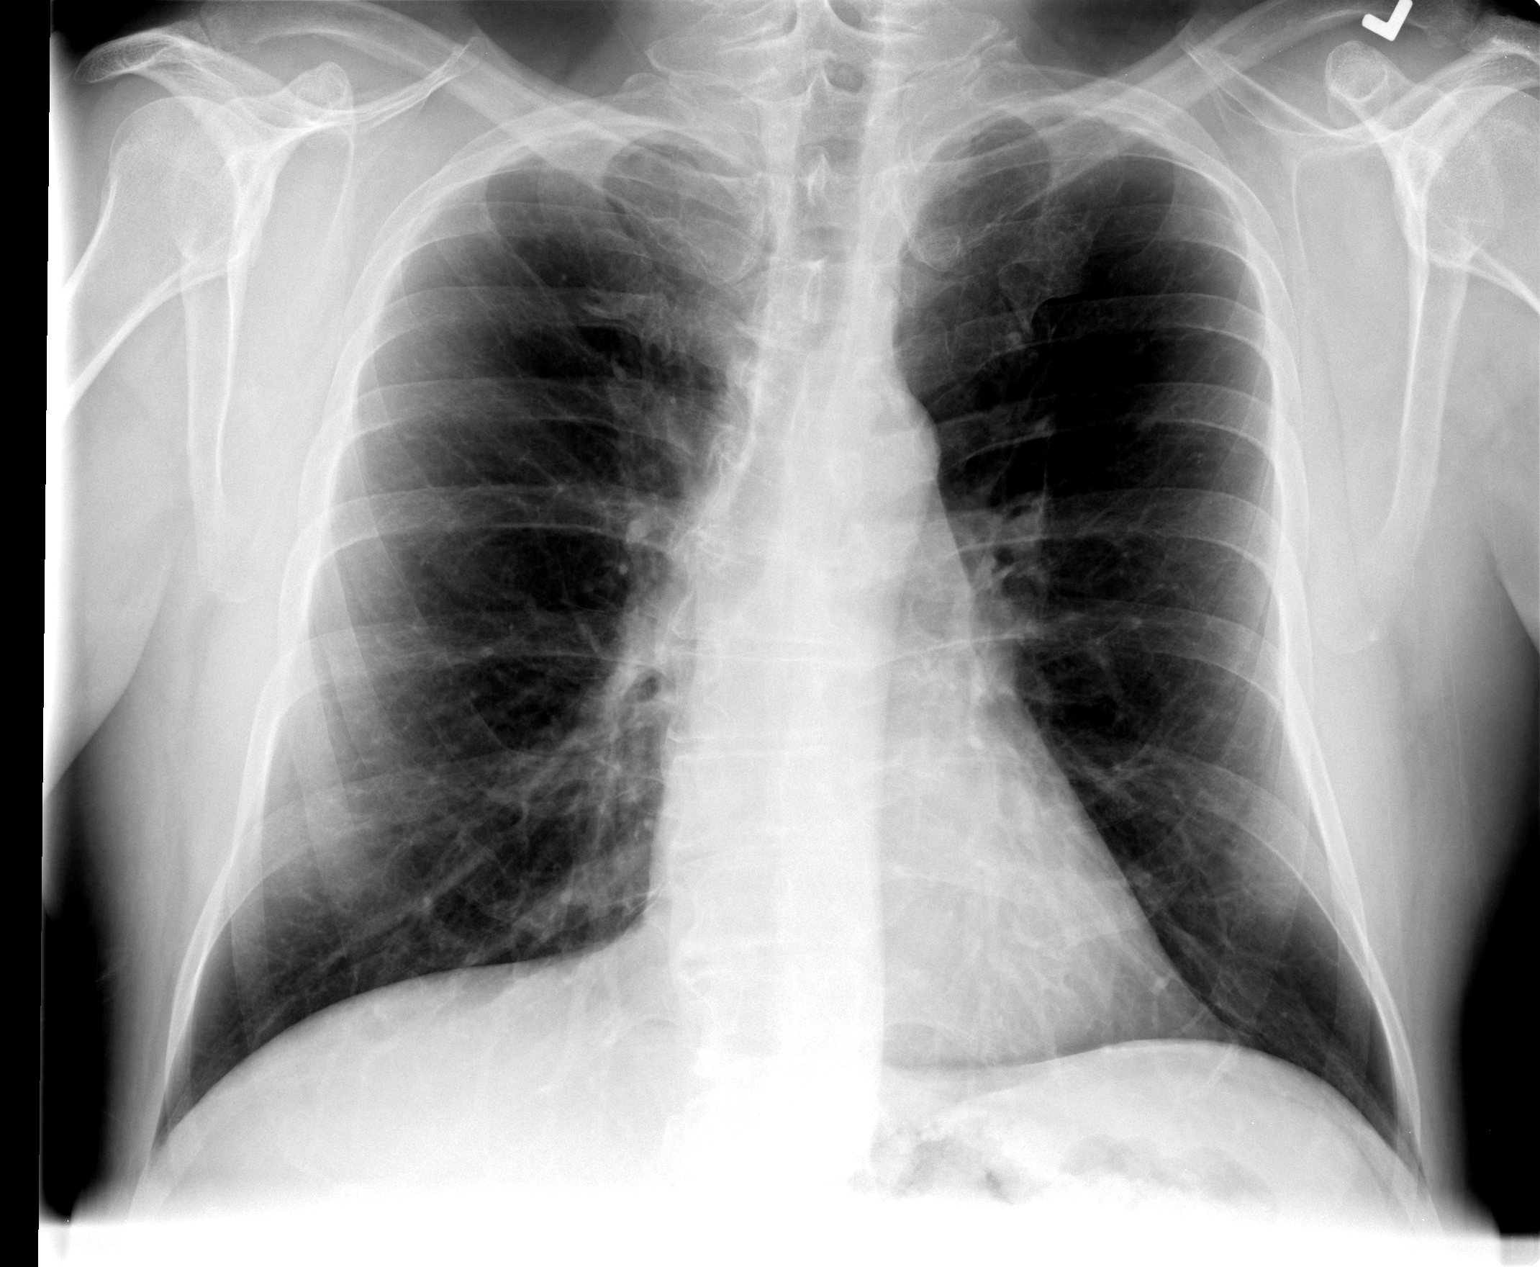

[view not recorded (2 of 2)]
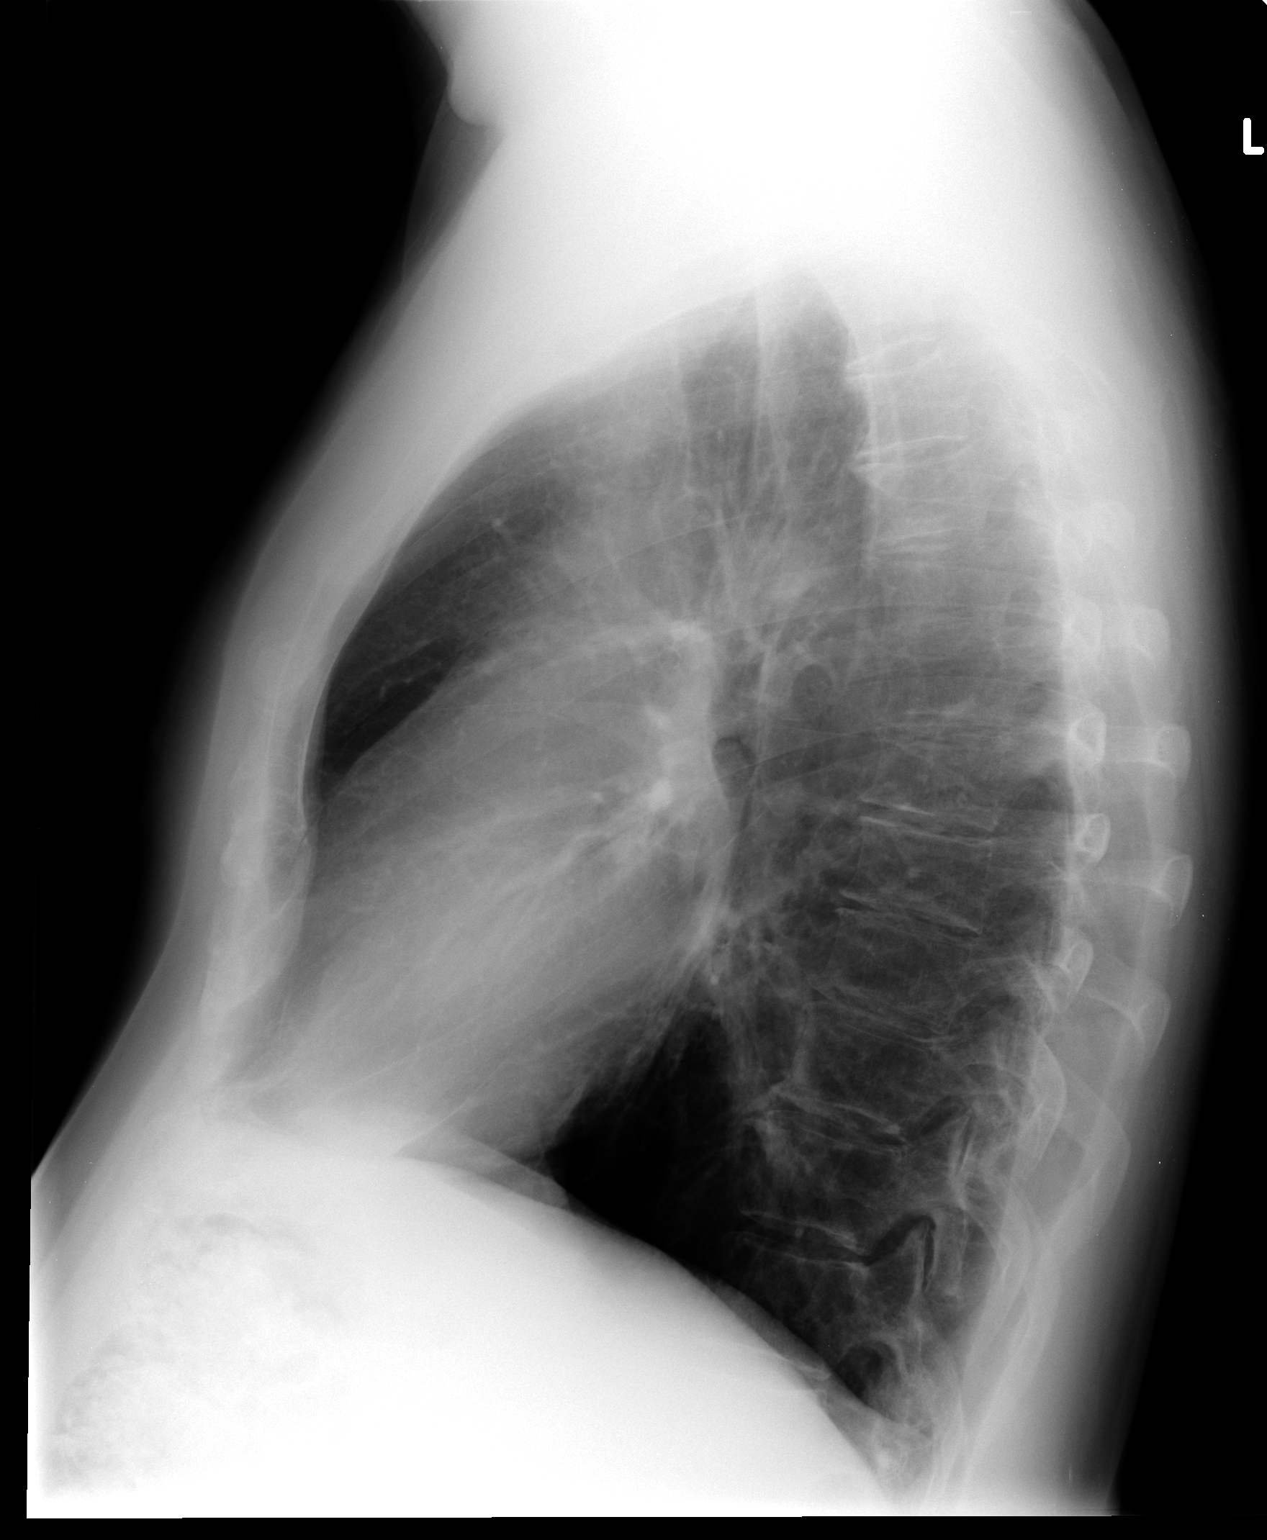

[2 of 2 positions shown; findings below may reference images not displayed]

FINDINGS: Cardiomediastinal silhouette unremarkable and unchanged. Stable
moderate hyperinflation. Lungs clear. Bronchovascular markings
normal. Pulmonary vascularity normal. No visible pleural effusions.
No pneumothorax. Degenerative changes involving the thoracic spine
and slight thoracic scoliosis convex right again noted.
IMPRESSION: Hyperinflation consistent with COPD and/or asthma. No acute
cardiopulmonary disease. Stable examination.

## 2015-06-29 ENCOUNTER — Other Ambulatory Visit: Payer: Self-pay | Admitting: Family Medicine

## 2015-06-29 ENCOUNTER — Encounter: Payer: Self-pay | Admitting: Internal Medicine

## 2015-06-29 MED ORDER — METOPROLOL SUCCINATE ER 200 MG PO TB24: ORAL_TABLET | ORAL | Status: DC

## 2015-06-29 NOTE — Telephone Encounter (Signed)
Last seen 1/29 for checkup. Pt was given #15 last time and told he needed ov. No visit scheduled

## 2015-06-29 NOTE — Telephone Encounter (Signed)
30 tabs, pt must come in for ov I rec him to schedule

## 2015-06-29 NOTE — Telephone Encounter (Signed)
30 day supply was sent in, needs office visit. [facility] (Vm box not setup)

## 2015-06-29 NOTE — Telephone Encounter (Signed)
metoprolol (TOPROL-XL) 200 MG 24 hr tablet  Pt needs a refill on this med please, he is out of town currently an  Can not make it in for an appt   [facility]

## 2015-08-18 ENCOUNTER — Other Ambulatory Visit: Payer: Self-pay | Admitting: Family Medicine

## 2015-09-20 ENCOUNTER — Other Ambulatory Visit: Payer: Self-pay | Admitting: Family Medicine

## 2015-09-21 NOTE — Telephone Encounter (Signed)
Last seen January 2016. May I refill?

## 2015-09-21 NOTE — Telephone Encounter (Signed)
May refill 3, send card needs office visit by January

## 2015-10-11 ENCOUNTER — Other Ambulatory Visit: Payer: Self-pay | Admitting: Family Medicine

## 2015-10-12 NOTE — Telephone Encounter (Signed)
2 refills needs office visit

## 2016-01-26 ENCOUNTER — Other Ambulatory Visit: Payer: Self-pay | Admitting: Family Medicine

## 2016-02-06 ENCOUNTER — Other Ambulatory Visit: Payer: Self-pay | Admitting: Family Medicine

## 2016-04-26 DIAGNOSIS — R972 Elevated prostate specific antigen [PSA]: Secondary | ICD-10-CM | POA: Diagnosis not present

## 2016-05-01 DIAGNOSIS — R972 Elevated prostate specific antigen [PSA]: Secondary | ICD-10-CM | POA: Diagnosis not present

## 2016-08-02 ENCOUNTER — Other Ambulatory Visit: Payer: Self-pay | Admitting: Family Medicine

## 2016-09-03 ENCOUNTER — Other Ambulatory Visit: Payer: Self-pay | Admitting: Family Medicine

## 2016-09-04 NOTE — Telephone Encounter (Signed)
30 day only-needs ov

## 2016-09-24 ENCOUNTER — Other Ambulatory Visit: Payer: Self-pay | Admitting: Family Medicine

## 2016-09-25 NOTE — Telephone Encounter (Signed)
Patient has not been seen since 11/2014. Ok to refill?

## 2016-09-25 NOTE — Telephone Encounter (Signed)
14 day prescription, 1 refill additional, send card or letter telling him office visit needed or no further refills

## 2016-09-25 NOTE — Telephone Encounter (Signed)
Needs o v dr Nicki Reaper is actually his provider, ask scott if willing to rx more meanwhile

## 2016-10-08 ENCOUNTER — Other Ambulatory Visit: Payer: Self-pay | Admitting: Family Medicine

## 2016-10-09 NOTE — Telephone Encounter (Signed)
Refuse a medication-patient has had ample opportunity to be seen and has failed to follow-up. Patient will need to call to set up appointment a request enough medicine to last till that appointment

## 2016-10-11 ENCOUNTER — Telehealth: Payer: Self-pay | Admitting: Family Medicine

## 2016-10-11 MED ORDER — METOPROLOL SUCCINATE ER 200 MG PO TB24: 100.0000 mg | ORAL_TABLET | Freq: Every day | ORAL | 0 refills | Status: DC

## 2016-10-11 NOTE — Telephone Encounter (Signed)
Pt is needing a refill on his metoprolol (TOPROL-XL) 200 MG 24 hr tablet  Pt has an appt scheduled in two weeks.  [facility] 

## 2016-10-11 NOTE — Telephone Encounter (Signed)
Tried to call no answer 10/11/16

## 2016-10-25 ENCOUNTER — Encounter: Payer: Self-pay | Admitting: Family Medicine

## 2016-10-25 ENCOUNTER — Ambulatory Visit (INDEPENDENT_AMBULATORY_CARE_PROVIDER_SITE_OTHER): Payer: BLUE CROSS/BLUE SHIELD | Admitting: Family Medicine

## 2016-10-25 VITALS — BP 132/80 | Ht 69.0 in | Wt 196.8 lb

## 2016-10-25 DIAGNOSIS — M549 Dorsalgia, unspecified: Secondary | ICD-10-CM

## 2016-10-25 DIAGNOSIS — R350 Frequency of micturition: Secondary | ICD-10-CM | POA: Diagnosis not present

## 2016-10-25 DIAGNOSIS — R002 Palpitations: Secondary | ICD-10-CM | POA: Diagnosis not present

## 2016-10-25 DIAGNOSIS — Z23 Encounter for immunization: Secondary | ICD-10-CM | POA: Diagnosis not present

## 2016-10-25 DIAGNOSIS — F411 Generalized anxiety disorder: Secondary | ICD-10-CM

## 2016-10-25 LAB — POCT URINALYSIS DIPSTICK
SPEC GRAV UA: 1.02
pH, UA: 7

## 2016-10-25 MED ORDER — MELOXICAM 15 MG PO TABS: 15.0000 mg | ORAL_TABLET | Freq: Every day | ORAL | 2 refills | Status: DC

## 2016-10-25 MED ORDER — METOPROLOL SUCCINATE ER 100 MG PO TB24: 100.0000 mg | ORAL_TABLET | Freq: Every day | ORAL | 12 refills | Status: DC

## 2016-10-25 MED ORDER — CITALOPRAM HYDROBROMIDE 20 MG PO TABS: ORAL_TABLET | ORAL | 12 refills | Status: DC

## 2016-10-25 NOTE — Progress Notes (Signed)
   Subjective:    Patient ID: Jack Dash., male    DOB: [birth year], 58 y.o.   MRN: [id number]  Hypertension  This is a chronic problem. The current episode started more than 1 year ago. Pertinent negatives include no chest pain. Risk factors for coronary artery disease include male gender. Treatments tried: metoprolol. There are no compliance problems.    Patient is up-to-date on colonoscopy he is followed by urologist on a regular basis to follow his PSA Patient with c/o back painHe describes this as mid back pain on the left side happens a few times a day intermittently over the past several months occasionally radiates around toward the left side sometimes happens when he sitting still sometimes with activity there is no abdominal pressure tightness with it no vomiting or diarrhea denies rectal bleeding denies hematuria.  Review of Systems  Constitutional: Negative for activity change, appetite change and fatigue.  HENT: Negative for congestion.   Respiratory: Negative for cough.   Cardiovascular: Negative for chest pain.  Gastrointestinal: Negative for abdominal pain.  Endocrine: Negative for polydipsia and polyphagia.  Neurological: Negative for weakness.  Psychiatric/Behavioral: Negative for confusion.       Objective:   Physical Exam  Constitutional: He appears well-nourished. No distress.  Cardiovascular: Normal rate, regular rhythm and normal heart sounds.   No murmur heard. Pulmonary/Chest: Effort normal and breath sounds normal. No respiratory distress.  Musculoskeletal: He exhibits no edema.  Lymphadenopathy:    He has no cervical adenopathy.  Neurological: He is alert.  Psychiatric: His behavior is normal.  Vitals reviewed.         Assessment & Plan:  Flank pain discomfort I doubt that this is kidney stone I doubt it is a tumor I think more likely a pinched nerve in the back we will try anti-inflammatory over the next several weeks if that does not help  he will let us know and then we will proceed forward with some testing and possible referral  Anxiety related issues he's aspirin increase in Celexa from 10 mg to 20 mg he will let us know if he is having ongoing troubles or problems he denies being depressed  Blood pressure overall good heart rate staying controlled metoprolol 100 mg refills given  He is followed by specialist for his prostate they follow his PSA every 6-12 months

## 2016-11-11 ENCOUNTER — Other Ambulatory Visit: Payer: Self-pay | Admitting: Family Medicine

## 2017-05-09 DIAGNOSIS — R972 Elevated prostate specific antigen [PSA]: Secondary | ICD-10-CM | POA: Diagnosis not present

## 2017-05-14 DIAGNOSIS — N138 Other obstructive and reflux uropathy: Secondary | ICD-10-CM | POA: Diagnosis not present

## 2017-05-14 DIAGNOSIS — R350 Frequency of micturition: Secondary | ICD-10-CM | POA: Diagnosis not present

## 2017-05-14 DIAGNOSIS — N5201 Erectile dysfunction due to arterial insufficiency: Secondary | ICD-10-CM | POA: Diagnosis not present

## 2017-05-18 ENCOUNTER — Ambulatory Visit (INDEPENDENT_AMBULATORY_CARE_PROVIDER_SITE_OTHER): Payer: BLUE CROSS/BLUE SHIELD | Admitting: Nurse Practitioner

## 2017-05-18 ENCOUNTER — Encounter: Payer: Self-pay | Admitting: Nurse Practitioner

## 2017-05-18 VITALS — BP 132/82 | Temp 97.6°F | Ht 69.0 in | Wt 194.0 lb

## 2017-05-18 DIAGNOSIS — H6502 Acute serous otitis media, left ear: Secondary | ICD-10-CM | POA: Diagnosis not present

## 2017-05-18 DIAGNOSIS — J329 Chronic sinusitis, unspecified: Secondary | ICD-10-CM | POA: Diagnosis not present

## 2017-05-18 DIAGNOSIS — J310 Chronic rhinitis: Secondary | ICD-10-CM

## 2017-05-18 MED ORDER — AMOXICILLIN-POT CLAVULANATE 875-125 MG PO TABS: 1.0000 | ORAL_TABLET | Freq: Two times a day (BID) | ORAL | 0 refills | Status: DC

## 2017-05-18 MED ORDER — PREDNISONE 20 MG PO TABS: ORAL_TABLET | ORAL | 0 refills | Status: DC

## 2017-05-18 MED ORDER — METHYLPREDNISOLONE ACETATE 40 MG/ML IJ SUSP
40.0000 mg | Freq: Once | INTRAMUSCULAR | Status: AC
  Administered 2017-05-18: 40 mg via INTRAMUSCULAR

## 2017-05-18 NOTE — Progress Notes (Signed)
Subjective:  Presents for complaints of a stopped up left ear with difficulty hearing that began 5 days ago. Removed a large amount of white, has tried multiple irrigations since then with no results. No fever. Head congestion. No sore throat. Mild sinus pressure but no headache. Minimal cough. Producing green mucus at times. Taking daily generic Claritin. Having to avoid Sudafed or certain other medications due to urinary side effects.  Objective:   BP 132/82   Temp 97.6 F (36.4 C) (Oral)   Ht 5\' 9"  (1.753 m)   Wt 194 lb (88 kg)   BMI 28.65 kg/m  NAD. Alert, oriented. Right TM significant clear effusion, no erythema. Left TM partially obscured with a plug of dark cerumen but what can be visualized has fluid and no erythema. There is a tiny erythematous area in the ear canal near the TM but no active bleeding. Otherwise canal is clear. Pharynx mildly injected with slightly green PND noted. Neck supple with mild soft anterior adenopathy. Lungs clear. Heart regular rate rhythm.  Assessment:  Rhinosinusitis - Plan: methylPREDNISolone acetate (DEPO-MEDROL) injection 40 mg  Acute serous otitis media of left ear, recurrence not specified - Plan: methylPREDNISolone acetate (DEPO-MEDROL) injection 40 mg    Plan:   Meds ordered this encounter  Medications  . amoxicillin-clavulanate (AUGMENTIN) 875-125 MG tablet    Sig: Take 1 tablet by mouth 2 (two) times daily.    Dispense:  20 tablet    Refill:  0    Order Specific Question:   Supervising Provider    Answer:   Mikey Kirschner [[id number]]  . predniSONE (DELTASONE) 20 MG tablet    Sig: 3 po qd x 3 d then 2 po qd x 3 d then 1 po qd x 2 d    Dispense:  17 tablet    Refill:  0    Start over weekend if no improvement in sinus congestion    Order Specific Question:   Supervising Provider    Answer:   Mikey Kirschner [[id number]]  . methylPREDNISolone acetate (DEPO-MEDROL) injection 40 mg   Start prednisone over the weekend if no improvement in  symptoms. Start steroid nasal spray. Continue Claritin as directed. Call back if symptoms worsen or persist.

## 2017-05-18 NOTE — Patient Instructions (Signed)
Flonase, nasacort or rhinocort

## 2017-05-28 DIAGNOSIS — M79671 Pain in right foot: Secondary | ICD-10-CM | POA: Diagnosis not present

## 2017-05-28 DIAGNOSIS — M722 Plantar fascial fibromatosis: Secondary | ICD-10-CM | POA: Diagnosis not present

## 2017-05-29 DIAGNOSIS — H6123 Impacted cerumen, bilateral: Secondary | ICD-10-CM | POA: Diagnosis not present

## 2017-05-29 DIAGNOSIS — R0981 Nasal congestion: Secondary | ICD-10-CM | POA: Diagnosis not present

## 2017-05-29 DIAGNOSIS — H6983 Other specified disorders of Eustachian tube, bilateral: Secondary | ICD-10-CM | POA: Diagnosis not present

## 2017-05-29 DIAGNOSIS — R0982 Postnasal drip: Secondary | ICD-10-CM | POA: Diagnosis not present

## 2017-06-25 ENCOUNTER — Encounter: Payer: Self-pay | Admitting: Family Medicine

## 2017-06-25 ENCOUNTER — Ambulatory Visit (INDEPENDENT_AMBULATORY_CARE_PROVIDER_SITE_OTHER): Payer: BLUE CROSS/BLUE SHIELD | Admitting: Family Medicine

## 2017-06-25 VITALS — BP 132/80 | Temp 98.3°F | Ht 69.0 in | Wt 188.0 lb

## 2017-06-25 DIAGNOSIS — E784 Other hyperlipidemia: Secondary | ICD-10-CM

## 2017-06-25 DIAGNOSIS — E7849 Other hyperlipidemia: Secondary | ICD-10-CM

## 2017-06-25 DIAGNOSIS — K141 Geographic tongue: Secondary | ICD-10-CM

## 2017-06-25 DIAGNOSIS — Z131 Encounter for screening for diabetes mellitus: Secondary | ICD-10-CM

## 2017-06-25 MED ORDER — METOPROLOL SUCCINATE ER 100 MG PO TB24: 100.0000 mg | ORAL_TABLET | Freq: Every day | ORAL | 12 refills | Status: DC

## 2017-06-25 MED ORDER — CITALOPRAM HYDROBROMIDE 20 MG PO TABS: ORAL_TABLET | ORAL | 6 refills | Status: DC

## 2017-06-25 MED ORDER — NYSTATIN 100000 UNIT/ML MT SUSP: OROMUCOSAL | 0 refills | Status: DC

## 2017-06-25 NOTE — Progress Notes (Signed)
   Subjective:    Patient ID: Jack Dash., male    DOB: [birth year], 61 y.o.   MRN: [id number]  Sinusitis  This is a new problem. Episode onset: 2 weeks. Associated symptoms include congestion. (Ear pain, sore on tongue)   Patient does do chewing tobacco He knows he needs to quit He is been counseled to do so He relates no soreness on the tongue he just states that that area on the tongue on the left side is become smooth He also relates some burning in the throat recently been on prednisone Denies any head congestion drainage Feels fullness in his left ear recently had cerumen removed by the ENT   Review of Systems  HENT: Positive for congestion.   Denies wheezing difficulty breathing high fever chills     Objective:   Physical Exam Patient has chronic gum changes on the right lower side from chewing tobacco but no leukoplakia No neck masses were found on a detailed external and internal exam Geographic tongue the left side Lungs clear heart regular       Assessment & Plan:  Screening lab work Geographic tongue Possible thrush related symptoms Nystatin oral solution as directed If persistent ENT issues referral back to Dr. Wilburn Cornelia Patient was encouraged to quit all chewing tobacco Cholesterol screening glucose screening recommended

## 2017-07-10 ENCOUNTER — Encounter (HOSPITAL_COMMUNITY): Payer: Self-pay | Admitting: Cardiology

## 2017-07-10 ENCOUNTER — Emergency Department (HOSPITAL_COMMUNITY)
Admission: EM | Admit: 2017-07-10 | Discharge: 2017-07-10 | Disposition: A | Discharge status: Home / Self Care | Payer: BLUE CROSS/BLUE SHIELD | Attending: Emergency Medicine | Admitting: Emergency Medicine

## 2017-07-10 ENCOUNTER — Emergency Department (HOSPITAL_COMMUNITY): Payer: BLUE CROSS/BLUE SHIELD

## 2017-07-10 DIAGNOSIS — R200 Anesthesia of skin: Secondary | ICD-10-CM | POA: Diagnosis not present

## 2017-07-10 DIAGNOSIS — M79652 Pain in left thigh: Secondary | ICD-10-CM | POA: Diagnosis not present

## 2017-07-10 DIAGNOSIS — Z87891 Personal history of nicotine dependence: Secondary | ICD-10-CM | POA: Diagnosis not present

## 2017-07-10 DIAGNOSIS — Z79899 Other long term (current) drug therapy: Secondary | ICD-10-CM | POA: Insufficient documentation

## 2017-07-10 DIAGNOSIS — M79605 Pain in left leg: Secondary | ICD-10-CM | POA: Diagnosis not present
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
## 2017-07-10 NOTE — ED Triage Notes (Signed)
Left leg pain and numbness since last night.

## 2017-07-10 NOTE — Discharge Instructions (Addendum)
Please see a physician or return to the emergency department if he develop unilateral weakness, persistent numbness, cold extremity/foot, rash or new concerns.  If you were given medicines take as directed.  If you are on coumadin or contraceptives realize their levels and effectiveness is altered by many different medicines.  If you have any reaction (rash, tongues swelling, other) to the medicines stop taking and see a physician.    If your blood pressure was elevated in the ER make sure you follow up for management with a primary doctor or return for chest pain, shortness of breath or stroke symptoms.  Please follow up as directed and return to the ER or see a physician for new or worsening symptoms.  Thank you. Vitals:   07/10/17 0724 07/10/17 0726 07/10/17 0727  BP: 140/70  136/79  Pulse: 80  88  Resp: 18  16  Temp: 97.6 F (36.4 C)  98 F (36.7 C)  TempSrc: Oral  Oral  SpO2: 100%  100%  Weight:  81.6 kg (180 lb)   Height:  5\' 10"  (1.778 m)

## 2017-08-07 DIAGNOSIS — J310 Chronic rhinitis: Secondary | ICD-10-CM | POA: Diagnosis not present

## 2017-08-07 DIAGNOSIS — H6983 Other specified disorders of Eustachian tube, bilateral: Secondary | ICD-10-CM | POA: Diagnosis not present

## 2017-10-05 DIAGNOSIS — Z23 Encounter for immunization: Secondary | ICD-10-CM | POA: Diagnosis not present

## 2018-02-21 DIAGNOSIS — J209 Acute bronchitis, unspecified: Secondary | ICD-10-CM | POA: Diagnosis not present

## 2018-02-21 DIAGNOSIS — J069 Acute upper respiratory infection, unspecified: Secondary | ICD-10-CM | POA: Diagnosis not present

## 2018-02-21 DIAGNOSIS — J329 Chronic sinusitis, unspecified: Secondary | ICD-10-CM | POA: Diagnosis not present

## 2018-04-26 DIAGNOSIS — N400 Enlarged prostate without lower urinary tract symptoms: Secondary | ICD-10-CM | POA: Diagnosis not present

## 2018-05-07 DIAGNOSIS — R972 Elevated prostate specific antigen [PSA]: Secondary | ICD-10-CM | POA: Diagnosis not present

## 2018-05-07 DIAGNOSIS — N5201 Erectile dysfunction due to arterial insufficiency: Secondary | ICD-10-CM | POA: Diagnosis not present

## 2018-06-18 DIAGNOSIS — Z125 Encounter for screening for malignant neoplasm of prostate: Secondary | ICD-10-CM | POA: Diagnosis not present

## 2018-07-22 ENCOUNTER — Other Ambulatory Visit: Payer: Self-pay | Admitting: Family Medicine

## 2018-07-22 NOTE — Telephone Encounter (Signed)
30 tablets no additional refill needs office visit

## 2018-08-21 ENCOUNTER — Other Ambulatory Visit: Payer: Self-pay | Admitting: Family Medicine

## 2018-08-22 NOTE — Telephone Encounter (Signed)
Please refuse these patient has not been seen in over one year

## 2018-09-03 ENCOUNTER — Telehealth: Payer: Self-pay | Admitting: Family Medicine

## 2018-09-03 MED ORDER — METOPROLOL SUCCINATE ER 100 MG PO TB24: 100.0000 mg | ORAL_TABLET | Freq: Every day | ORAL | 0 refills | Status: DC

## 2018-09-03 MED ORDER — CITALOPRAM HYDROBROMIDE 20 MG PO TABS: ORAL_TABLET | ORAL | 0 refills | Status: DC

## 2018-09-03 NOTE — Telephone Encounter (Signed)
Prescriptions sent electronically to pharmacy 

## 2018-09-03 NOTE — Telephone Encounter (Signed)
Patient scheduled for 10-08-18 with Dr. Nicki Reaper but will run out of medicine before then. (Appts are booked out until November)  Would like a refill on Celexa 20 mg and Toprol XL 100 mg sent to [facility] in [place].  Patient will be at work but okay to leave a detailed message on his cell #.

## 2018-09-03 NOTE — Telephone Encounter (Signed)
It would be fine to do a 30-day prescription along with 1 refill of each medicine

## 2018-09-03 NOTE — Telephone Encounter (Signed)
Please advise 

## 2018-10-08 ENCOUNTER — Ambulatory Visit: Payer: BLUE CROSS/BLUE SHIELD | Admitting: Family Medicine

## 2018-10-08 ENCOUNTER — Encounter: Payer: Self-pay | Admitting: Family Medicine

## 2018-10-08 VITALS — BP 140/86 | Ht 70.0 in | Wt 198.8 lb

## 2018-10-08 DIAGNOSIS — Z23 Encounter for immunization: Secondary | ICD-10-CM | POA: Diagnosis not present

## 2018-10-08 DIAGNOSIS — F411 Generalized anxiety disorder: Secondary | ICD-10-CM

## 2018-10-08 MED ORDER — METOPROLOL SUCCINATE ER 100 MG PO TB24: 100.0000 mg | ORAL_TABLET | Freq: Every day | ORAL | 3 refills | Status: DC

## 2018-10-08 MED ORDER — CITALOPRAM HYDROBROMIDE 20 MG PO TABS: ORAL_TABLET | ORAL | 3 refills | Status: DC

## 2018-10-08 NOTE — Progress Notes (Signed)
   Subjective:    Patient ID: Jack Gilbert, male    DOB: 10-15-1956, 62 y.o.   MRN: [id number]  Medication Refill  Pertinent negatives include no abdominal pain, chest pain, congestion, coughing, diaphoresis, fatigue, headaches or rash.  Pt here today for medication refill.  Very nice gentleman Takes his medication as directed Denies any current troubles Would like to have refills on medicine He does see urologist on a regular basis to monitor his PSA   Review of Systems  Constitutional: Negative for diaphoresis and fatigue.  HENT: Negative for congestion and rhinorrhea.   Respiratory: Negative for cough and shortness of breath.   Cardiovascular: Negative for chest pain and leg swelling.  Gastrointestinal: Negative for abdominal pain and diarrhea.  Skin: Negative for color change and rash.  Neurological: Negative for dizziness and headaches.  Psychiatric/Behavioral: Negative for behavioral problems and confusion.       Objective:   Physical Exam  Constitutional: He appears well-nourished. No distress.  HENT:  Head: Normocephalic and atraumatic.  Eyes: Right eye exhibits no discharge. Left eye exhibits no discharge.  Neck: No tracheal deviation present.  Cardiovascular: Normal rate, regular rhythm and normal heart sounds.  No murmur heard. Pulmonary/Chest: Effort normal and breath sounds normal. No respiratory distress.  Musculoskeletal: He exhibits no edema.  Lymphadenopathy:    He has no cervical adenopathy.  Neurological: He is alert. Coordination normal.  Skin: Skin is warm and dry.  Psychiatric: He has a normal mood and affect. His behavior is normal.  Vitals reviewed.   Colonoscopy up-to-date Flu vaccine given today     Assessment & Plan:  Blood pressure good control Palpitations good control Continue metoprolol as is  Program doing well for anxiety continue current medicines  Elevated PSA is monitored by urology on a regular basis  Lab work pending  he will get this completed including cholesterol and sugar  Follow-up in 1 year follow-up sooner problems

## 2018-11-22 DIAGNOSIS — Z125 Encounter for screening for malignant neoplasm of prostate: Secondary | ICD-10-CM | POA: Diagnosis not present

## 2018-12-02 DIAGNOSIS — R972 Elevated prostate specific antigen [PSA]: Secondary | ICD-10-CM | POA: Diagnosis not present

## 2019-01-02 ENCOUNTER — Ambulatory Visit: Payer: BLUE CROSS/BLUE SHIELD | Admitting: Family Medicine

## 2019-01-02 VITALS — BP 144/80 | Ht 70.0 in | Wt 195.6 lb

## 2019-01-02 DIAGNOSIS — R739 Hyperglycemia, unspecified: Secondary | ICD-10-CM | POA: Diagnosis not present

## 2019-01-02 DIAGNOSIS — H538 Other visual disturbances: Secondary | ICD-10-CM

## 2019-01-02 DIAGNOSIS — L439 Lichen planus, unspecified: Secondary | ICD-10-CM | POA: Diagnosis not present

## 2019-01-02 DIAGNOSIS — E7849 Other hyperlipidemia: Secondary | ICD-10-CM

## 2019-01-02 LAB — POCT GLUCOSE (DEVICE FOR HOME USE): POC Glucose: 105 mg/dl — AB (ref 70–99)

## 2019-01-02 LAB — POCT GLYCOSYLATED HEMOGLOBIN (HGB A1C): Hemoglobin A1C: 4.7 % (ref 4.0–5.6)

## 2019-01-02 MED ORDER — TRIAMCINOLONE ACETONIDE 0.1 % EX CREA: 1.0000 "application " | TOPICAL_CREAM | Freq: Two times a day (BID) | CUTANEOUS | 2 refills | Status: DC | PRN

## 2019-01-02 NOTE — Progress Notes (Signed)
   Subjective:    Patient ID: Jack Gilbert, male    DOB: 13-Jan-1956, 63 y.o.   MRN: [id number]  HPIBlurry vision in right eye for awhile. Has appt March 3rd with eye doctor.  He relates the blurred vision is in the right eye does not appear to be in the left eye worse when headlights are shining or when it is raining he states he still safe to drive Vision checked today right eye 20/100 and left eye 20/25.  Concerned about diabetes. States his sugar has been elevated in the past.  Patient relates he does try to eat somewhat healthy Results for orders placed or performed in visit on 01/02/19  POCT Glucose (Device for Home Use)  Result Value Ref Range   Glucose Fasting, POC     POC Glucose 105 (A) 70 - 99 mg/dl  POCT glycosylated hemoglobin (Hb A1C)  Result Value Ref Range   Hemoglobin A1C 4.7 4.0 - 5.6 %   HbA1c POC (<> result, manual entry)     HbA1c, POC (prediabetic range)     HbA1c, POC (controlled diabetic range)      He also relates a rash on his penis that comes and goes itches he was concerned about ringworm or concerned about a bacterial infection no discharge no urethral issues  Review of Systems  Constitutional: Negative for activity change, chills and fever.  HENT: Positive for congestion and rhinorrhea. Negative for ear pain.   Eyes: Negative for discharge.  Respiratory: Positive for cough. Negative for wheezing.   Cardiovascular: Negative for chest pain.  Gastrointestinal: Negative for nausea and vomiting.  Musculoskeletal: Negative for arthralgias.       Objective:   Physical Exam Vitals signs reviewed.  Constitutional:      General: He is not in acute distress. HENT:     Head: Normocephalic and atraumatic.  Eyes:     General:        Right eye: No discharge.        Left eye: No discharge.  Neck:     Trachea: No tracheal deviation.  Cardiovascular:     Rate and Rhythm: Normal rate and regular rhythm.     Heart sounds: Normal heart sounds. No murmur.    Pulmonary:     Effort: Pulmonary effort is normal. No respiratory distress.     Breath sounds: Normal breath sounds.  Lymphadenopathy:     Cervical: No cervical adenopathy.  Skin:    General: Skin is warm and dry.  Neurological:     Mental Status: He is alert.     Coordination: Coordination normal.  Psychiatric:        Behavior: Behavior normal.    More than likely lichen planus on the penile shaft not severe no sign of any type of herpes or blisters Eye exam pupils constricted but appears that he may have a cataract on the right side left side normal no obvious infection noted      Assessment & Plan:  Blurred vision could be due to cataract very important for him to see the eye doctor and get a thorough evaluation follow-up here problems  Screening lab work indicated no sign of diabetes  Lichen planus low-dose steroid cream twice daily.  As directed if ongoing troubles notify us  Wellness exam the summer  Patient is followed by [facility] for his prostate issues

## 2019-01-06 ENCOUNTER — Ambulatory Visit: Payer: BLUE CROSS/BLUE SHIELD | Admitting: Family Medicine

## 2019-03-24 ENCOUNTER — Telehealth: Payer: Self-pay | Admitting: Family Medicine

## 2019-03-24 MED ORDER — DESONIDE 0.05 % EX CREA: TOPICAL_CREAM | CUTANEOUS | 0 refills | Status: DC

## 2019-03-24 NOTE — Telephone Encounter (Signed)
May try DesOwen cream apply thin amount twice daily as needed, 15 g tube  If this does not clear up the issue the next step I would recommend is dermatology consult

## 2019-03-24 NOTE — Telephone Encounter (Signed)
Patient states place on penis not any better can you call in something else to [facility]

## 2019-03-24 NOTE — Telephone Encounter (Signed)
Please advise. Thank you

## 2019-03-24 NOTE — Telephone Encounter (Signed)
Medication sent in and patient spouse contacted. Pt wife verbalized understanding.

## 2019-05-01 DIAGNOSIS — B0089 Other herpesviral infection: Secondary | ICD-10-CM | POA: Diagnosis not present

## 2019-06-12 DIAGNOSIS — H25813 Combined forms of age-related cataract, bilateral: Secondary | ICD-10-CM | POA: Diagnosis not present

## 2019-07-03 NOTE — H&P (Signed)
Surgical History & Physical  Patient Name: Jack Gilbert DOB: 1956/11/19  Surgery: Cataract extraction with intraocular lens implant phacoemulsification; Right Eye  Surgeon: Baruch Goldmann MD Surgery Date:  07/17/2019 Pre-Op Date:  07/03/2019  HPI: A 38 Yr. old male patient referred by Dr. Hassell Done 1. 1. The patient complains of difficulty when driving, which began 6 months ago. The right eye is affected. The episode is gradual. The patient describes foggy, glare and hazy symptoms affecting their eyes/vision. Patient unable to see with OD clearly if OS is closed. Significant blurred VA distance and near. Significant glare with street lights and car lights. Patient is using OS primarily to see well. Closes OD to see better when needed for work. This is negatively affecting the patient's quality of life. Patient interested in cataract surgery to improve BCVA. HPI Completed by Dr. Baruch Goldmann  Medical History: Cataracts Heart Problem  Review of Systems Negative Allergic/Immunologic Negative Cardiovascular Negative Constitutional Negative Ear, Nose, Mouth & Throat Negative Endocrine Negative Eyes Negative Gastrointestinal Negative Genitourinary Negative Hemotologic/Lymphatic Negative Integumentary Negative Musculoskeletal Negative Neurological Negative Psychiatry Negative Respiratory  Social   Former smoker   Medication Metoprolol,   Sx/Procedures Gallbladder Removal, Back Surgery,   Drug Allergies   NKDA  History & Physical: Heent:  Cataract, Right eye NECK: supple without bruits LUNGS: lungs clear to auscultation CV: regular rate and rhythm Abdomen: soft and non-tender  Impression & Plan: Assessment: 1.  COMBINED FORMS AGE RELATED CATARACT; Both Eyes (H25.813)  Plan: 1.  Cataract accounts for the patient's decreased vision. This visual impairment is not correctable with a tolerable change in glasses or contact lenses. Cataract surgery with an implantation of a new  lens should significantly improve the visual and functional status of the patient. Discussed all risks, benefits, alternatives, and potential complications. Discussed the procedures and recovery. Patient desires to have surgery. A-scan ordered and performed today for intra-ocular lens calculations. The surgery will be performed in order to improve vision for driving, reading, and for eye examinations. Recommend phacoemulsification with intra-ocular lens. Right Eye worse - first. Dilates well - shugarcaine by protocol.

## 2019-07-10 DIAGNOSIS — H25811 Combined forms of age-related cataract, right eye: Secondary | ICD-10-CM | POA: Diagnosis not present

## 2019-07-16 ENCOUNTER — Other Ambulatory Visit: Payer: Self-pay

## 2019-07-16 ENCOUNTER — Encounter (HOSPITAL_COMMUNITY)
Admission: RE | Admit: 2019-07-16 | Discharge: 2019-07-16 | Disposition: A | Discharge status: Home / Self Care | Payer: BC Managed Care – PPO | Source: Ambulatory Visit | Attending: Ophthalmology | Admitting: Ophthalmology

## 2019-07-16 ENCOUNTER — Other Ambulatory Visit (HOSPITAL_COMMUNITY)
Admission: RE | Admit: 2019-07-16 | Discharge: 2019-07-16 | Disposition: A | Discharge status: Home / Self Care | Payer: BC Managed Care – PPO | Source: Ambulatory Visit | Attending: Ophthalmology | Admitting: Ophthalmology

## 2019-07-16 ENCOUNTER — Encounter (HOSPITAL_COMMUNITY): Payer: Self-pay

## 2019-07-16 DIAGNOSIS — Z20828 Contact with and (suspected) exposure to other viral communicable diseases: Secondary | ICD-10-CM | POA: Diagnosis not present

## 2019-07-16 DIAGNOSIS — Z01812 Encounter for preprocedural laboratory examination: Secondary | ICD-10-CM | POA: Diagnosis not present

## 2019-07-16 LAB — SARS CORONAVIRUS 2 (TAT 6-24 HRS): SARS Coronavirus 2: NEGATIVE

## 2019-07-16 NOTE — Patient Instructions (Signed)
Jack Gilbert  07/16/2019     @PREFPERIOPPHARMACY @   Your procedure is scheduled on  07/17/2019  Report to [facility] at  [place].M.  Call this number if you have problems the morning of surgery:  [phone]   Remember:  Do not eat or drink after midnight.                         Take these medicines the morning of surgery with A SIP OF WATER  Celexa, metoprolol.    Do not wear jewelry, make-up or nail polish.  Do not wear lotions, powders, or perfumes. Please wear deodorant and brush your teeth.  Do not shave 48 hours prior to surgery.  Men may shave face and neck.  Do not bring valuables to the hospital.  [facility] is not responsible for any belongings or valuables.  Contacts, dentures or bridgework may not be worn into surgery.  Leave your suitcase in the car.  After surgery it may be brought to your room.  For patients admitted to the hospital, discharge time will be determined by your treatment team.  Patients discharged the day of surgery will not be allowed to drive home.   Name and phone number of your driver:   family Special instructions:  None  Please read over the following fact sheets that you were given. Anesthesia Post-op Instructions and Care and Recovery After Surgery       Cataract Surgery, Care After This sheet gives you information about how to care for yourself after your procedure. Your health care provider may also give you more specific instructions. If you have problems or questions, contact your health care provider. What can I expect after the procedure? After the procedure, it is common to have:  Itching.  Discomfort.  Fluid discharge.  Sensitivity to light and to touch.  Bruising in or around the eye.  Mild blurred vision. Follow these instructions at home: Eye care   Do not touch or rub your eyes.  Protect your eyes as told by your health care provider. You may be told to wear a protective eye shield or  sunglasses.  Do not put a contact lens into the affected eye or eyes until your health care provider approves.  Keep the area around your eye clean and dry: ? Avoid swimming. ? Do not allow water to hit you directly in the face while showering. ? Keep soap and shampoo out of your eyes.  Check your eye every day for signs of infection. Watch for: ? Redness, swelling, or pain. ? Fluid, blood, or pus. ? Warmth. ? A bad smell. ? Vision that is getting worse. ? Sensitivity that is getting worse. Activity  Do not drive for 24 hours if you were given a sedative during your procedure.  Avoid strenuous activities, such as playing contact sports, for as long as told by your health care provider.  Do not drive or use heavy machinery until your health care provider approves.  Do not bend or lift heavy objects. Bending increases pressure in the eye. You can walk, climb stairs, and do light household chores.  Ask your health care provider when you can return to work. If you work in a dusty environment, you may be advised to wear protective eyewear for a period of time. General instructions  Take or apply over-the-counter and prescription medicines only as told by your health care provider.  This includes eye drops.  Keep all follow-up visits as told by your health care provider. This is important. Contact a health care provider if:  You have increased bruising around your eye.  You have pain that is not helped with medicine.  You have a fever.  You have redness, swelling, or pain in your eye.  You have fluid, blood, or pus coming from your incision.  Your vision gets worse.  Your sensitivity to light gets worse. Get help right away if:  You have sudden loss of vision.  You see flashes of light or spots (floaters).  You have severe eye pain.  You develop nausea or vomiting. Summary  After your procedure, it is common to have itching, discomfort, bruising, fluid discharge, or  sensitivity to light.  Follow instructions from your health care provider about caring for your eye after the procedure.  Do not rub your eye after the procedure. You may need to wear eye protection or sunglasses. Do not wear contact lenses. Keep the area around your eye clean and dry.  Avoid activities that require a lot of effort. These include playing sports and lifting heavy objects.  Contact a health care provider if you have increased bruising, pain that does not go away, or a fever. Get help right away if you suddenly lose your vision, see flashes of light or spots, or have severe pain in the eye. This information is not intended to replace advice given to you by your health care provider. Make sure you discuss any questions you have with your health care provider. Document Released: 06/02/2005 Document Revised: 05/13/2018 Document Reviewed: 05/13/2018 Elsevier Patient Education  2020 [employer] These instructions provide you with information about caring for yourself after your procedure. Your health care provider may also give you more specific instructions. Your treatment has been planned according to current medical practices, but problems sometimes occur. Call your health care provider if you have any problems or questions after your procedure. What can I expect after the procedure? After your procedure, you may:  Feel sleepy for several hours.  Feel clumsy and have poor balance for several hours.  Feel forgetful about what happened after the procedure.  Have poor judgment for several hours.  Feel nauseous or vomit.  Have a sore throat if you had a breathing tube during the procedure. Follow these instructions at home: For at least 24 hours after the procedure:      Have a responsible adult stay with you. It is important to have someone help care for you until you are awake and alert.  Rest as needed.  Do not: ? Participate  in activities in which you could fall or become injured. ? Drive. ? Use heavy machinery. ? Drink alcohol. ? Take sleeping pills or medicines that cause drowsiness. ? Make important decisions or sign legal documents. ? Take care of children on your own. Eating and drinking  Follow the diet that is recommended by your health care provider.  If you vomit, drink water, juice, or soup when you can drink without vomiting.  Make sure you have little or no nausea before eating solid foods. General instructions  Take over-the-counter and prescription medicines only as told by your health care provider.  If you have sleep apnea, surgery and certain medicines can increase your risk for breathing problems. Follow instructions from your health care provider about wearing your sleep device: ? Anytime you are sleeping, including during daytime naps. ?  While taking prescription pain medicines, sleeping medicines, or medicines that make you drowsy.  If you smoke, do not smoke without supervision.  Keep all follow-up visits as told by your health care provider. This is important. Contact a health care provider if:  You keep feeling nauseous or you keep vomiting.  You feel light-headed.  You develop a rash.  You have a fever. Get help right away if:  You have trouble breathing. Summary  For several hours after your procedure, you may feel sleepy and have poor judgment.  Have a responsible adult stay with you for at least 24 hours or until you are awake and alert. This information is not intended to replace advice given to you by your health care provider. Make sure you discuss any questions you have with your health care provider. Document Released: 03/05/2016 Document Revised: 02/11/2018 Document Reviewed: 03/05/2016 Elsevier Patient Education  2020 [employer].

## 2019-07-17 ENCOUNTER — Encounter (HOSPITAL_COMMUNITY): Payer: Self-pay | Admitting: Emergency Medicine

## 2019-07-17 ENCOUNTER — Ambulatory Visit (HOSPITAL_COMMUNITY)
Admission: RE | Admit: 2019-07-17 | Discharge: 2019-07-17 | Disposition: A | Discharge status: Home / Self Care | Payer: BC Managed Care – PPO | Attending: Ophthalmology | Admitting: Ophthalmology

## 2019-07-17 ENCOUNTER — Ambulatory Visit (HOSPITAL_COMMUNITY): Payer: BC Managed Care – PPO | Admitting: Anesthesiology

## 2019-07-17 ENCOUNTER — Other Ambulatory Visit: Payer: Self-pay

## 2019-07-17 ENCOUNTER — Encounter (HOSPITAL_COMMUNITY)
Admission: RE | Disposition: A | Discharge status: Home / Self Care | Payer: Self-pay | Source: Home / Self Care | Attending: Ophthalmology

## 2019-07-17 DIAGNOSIS — Z87891 Personal history of nicotine dependence: Secondary | ICD-10-CM | POA: Insufficient documentation

## 2019-07-17 DIAGNOSIS — Z79899 Other long term (current) drug therapy: Secondary | ICD-10-CM | POA: Diagnosis not present

## 2019-07-17 DIAGNOSIS — H25811 Combined forms of age-related cataract, right eye: Secondary | ICD-10-CM | POA: Diagnosis not present

## 2019-07-17 HISTORY — PX: CATARACT EXTRACTION W/PHACO: SHX586

## 2019-07-17 SURGERY — PHACOEMULSIFICATION, CATARACT, WITH IOL INSERTION
Anesthesia: Monitor Anesthesia Care | Site: Eye | Laterality: Right

## 2019-07-17 MED ORDER — LIDOCAINE HCL (PF) 1 % IJ SOLN
INTRAOCULAR | Status: DC | PRN
  Administered 2019-07-17: 1 mL via OPHTHALMIC

## 2019-07-17 MED ORDER — PHENYLEPHRINE HCL 2.5 % OP SOLN
1.0000 [drp] | OPHTHALMIC | Status: AC | PRN
  Administered 2019-07-17 (×3): 1 [drp] via OPHTHALMIC

## 2019-07-17 MED ORDER — PROVISC 10 MG/ML IO SOLN
INTRAOCULAR | Status: DC | PRN
  Administered 2019-07-17: 0.85 mL via INTRAOCULAR

## 2019-07-17 MED ORDER — LIDOCAINE HCL 3.5 % OP GEL
1.0000 "application " | Freq: Once | OPHTHALMIC | Status: AC
  Administered 2019-07-17: 1 via OPHTHALMIC

## 2019-07-17 MED ORDER — BSS IO SOLN
INTRAOCULAR | Status: DC | PRN
  Administered 2019-07-17: 15 mL via INTRAOCULAR

## 2019-07-17 MED ORDER — EPINEPHRINE PF 1 MG/ML IJ SOLN
INTRAOCULAR | Status: DC | PRN
  Administered 2019-07-17: 500 mL

## 2019-07-17 MED ORDER — TETRACAINE HCL 0.5 % OP SOLN
1.0000 [drp] | OPHTHALMIC | Status: AC | PRN
  Administered 2019-07-17 (×3): 1 [drp] via OPHTHALMIC

## 2019-07-17 MED ORDER — NEOMYCIN-POLYMYXIN-DEXAMETH 3.5-10000-0.1 OP SUSP
OPHTHALMIC | Status: DC | PRN
  Administered 2019-07-17: 2 [drp] via OPHTHALMIC

## 2019-07-17 MED ORDER — POVIDONE-IODINE 5 % OP SOLN
OPHTHALMIC | Status: DC | PRN
  Administered 2019-07-17: 1 via OPHTHALMIC

## 2019-07-17 MED ORDER — LACTATED RINGERS IV SOLN: INTRAVENOUS | Status: DC

## 2019-07-17 MED ORDER — CYCLOPENTOLATE-PHENYLEPHRINE 0.2-1 % OP SOLN
1.0000 [drp] | OPHTHALMIC | Status: AC | PRN
  Administered 2019-07-17 (×3): 1 [drp] via OPHTHALMIC

## 2019-07-17 MED ORDER — SODIUM HYALURONATE 23 MG/ML IO SOLN
INTRAOCULAR | Status: DC | PRN
  Administered 2019-07-17: 0.6 mL via INTRAOCULAR

## 2019-07-17 SURGICAL SUPPLY — 17 items
CLOTH BEACON ORANGE TIMEOUT ST (SAFETY) ×1 IMPLANT
DEVICE MILOOP (MISCELLANEOUS) IMPLANT
EYE SHIELD UNIVERSAL CLEAR (GAUZE/BANDAGES/DRESSINGS) ×1 IMPLANT
GLOVE BIOGEL PI IND STRL 6.5 (GLOVE) IMPLANT
GLOVE BIOGEL PI IND STRL 7.0 (GLOVE) IMPLANT
GLOVE BIOGEL PI INDICATOR 6.5 (GLOVE) ×1
GLOVE BIOGEL PI INDICATOR 7.0 (GLOVE) ×1
LENS ALC ACRYL/TECN (Ophthalmic Related) ×1 IMPLANT
MILOOP DEVICE (MISCELLANEOUS)
NDL HYPO 18GX1.5 BLUNT FILL (NEEDLE) IMPLANT
NEEDLE HYPO 18GX1.5 BLUNT FILL (NEEDLE) ×2 IMPLANT
PAD ARMBOARD 7.5X6 YLW CONV (MISCELLANEOUS) ×1 IMPLANT
RING MALYGIN (MISCELLANEOUS) IMPLANT
SYR TB 1ML LL NO SAFETY (SYRINGE) ×1 IMPLANT
TAPE TRANSPARENT 1/2IN (GAUZE/BANDAGES/DRESSINGS) ×1 IMPLANT
VISCOELASTIC ADDITIONAL (OPHTHALMIC RELATED) ×1 IMPLANT
WATER STERILE IRR 250ML POUR (IV SOLUTION) ×1 IMPLANT

## 2019-07-17 NOTE — Interval H&P Note (Signed)
History and Physical Interval Note: The H and P was reviewed and updated. The patient was examined.  No changes were found after exam.  The surgical eye was marked.  07/17/2019 9:32 AM  Jack Gilbert  has presented today for surgery, with the diagnosis of nuclear cataract right eye.  The various methods of treatment have been discussed with the patient and family. After consideration of risks, benefits and other options for treatment, the patient has consented to  Procedure(s) with comments: CATARACT EXTRACTION PHACO AND INTRAOCULAR LENS PLACEMENT (IOC) (Right) - right as a surgical intervention.  The patient's history has been reviewed, patient examined, no change in status, stable for surgery.  I have reviewed the patient's chart and labs.  Questions were answered to the patient's satisfaction.     Baruch Goldmann

## 2019-07-17 NOTE — Anesthesia Postprocedure Evaluation (Signed)
Anesthesia Post Note  Patient: Jack Gilbert  Procedure(s) Performed: CATARACT EXTRACTION PHACO AND INTRAOCULAR LENS PLACEMENT (IOC) (Right Eye)  Patient location during evaluation: Short Stay Anesthesia Type: MAC Level of consciousness: awake, oriented and patient cooperative Pain management: pain level controlled Vital Signs Assessment: post-procedure vital signs reviewed and stable Respiratory status: spontaneous breathing and respiratory function stable Cardiovascular status: blood pressure returned to baseline Postop Assessment: no apparent nausea or vomiting Anesthetic complications: no     Last Vitals:  Vitals:   07/17/19 0857  BP: 106/73  Pulse: (!) 59  Resp: 16  Temp: 36.5 C  SpO2: 97%    Last Pain:  Vitals:   07/17/19 0857  TempSrc: Oral  PainSc: 0-No pain                 Clover Feehan J

## 2019-07-17 NOTE — Anesthesia Preprocedure Evaluation (Signed)
Anesthesia Evaluation  Patient identified by MRN, date of birth, ID band Patient awake    Reviewed: Allergy & Precautions, NPO status , Patient's Chart, lab work & pertinent test results, reviewed documented beta blocker date and time   Airway Mallampati: II  TM Distance: >3 FB Neck ROM: Full    Dental no notable dental hx. (+) Teeth Intact   Pulmonary neg pulmonary ROS, former smoker,    Pulmonary exam normal breath sounds clear to auscultation       Cardiovascular Exercise Tolerance: Good negative cardio ROS Normal cardiovascular examI Rhythm:Regular Rate:Normal  Pt on B Blockers for Palpitations in past - states none recently    Neuro/Psych Anxiety negative neurological ROS  negative psych ROS   GI/Hepatic Neg liver ROS, hiatal hernia, neg GERD  ,Denies GERD /Sx or meds  Denies GERD today     Endo/Other  negative endocrine ROS  Renal/GU negative Renal ROS  negative genitourinary   Musculoskeletal negative musculoskeletal ROS (+)   Abdominal   Peds negative pediatric ROS (+)  Hematology negative hematology ROS (+)   Anesthesia Other Findings   Reproductive/Obstetrics negative OB ROS                             Anesthesia Physical Anesthesia Plan  ASA: II  Anesthesia Plan: MAC   Post-op Pain Management:    Induction: Intravenous  PONV Risk Score and Plan: TIVA and Treatment may vary due to age or medical condition  Airway Management Planned: Nasal Cannula  Additional Equipment:   Intra-op Plan:   Post-operative Plan:   Informed Consent: I have reviewed the patients History and Physical, chart, labs and discussed the procedure including the risks, benefits and alternatives for the proposed anesthesia with the patient or authorized representative who has indicated his/her understanding and acceptance.     Dental advisory given  Plan Discussed with: CRNA  Anesthesia  Plan Comments: (Plan Full PPE use  Plan MAC -d/w pt -WTP with same after Q&A)        Anesthesia Quick Evaluation

## 2019-07-17 NOTE — Op Note (Signed)
Date of procedure: 07/17/19  Pre-operative diagnosis: Visually significant age-related cataract, Right Eye (H25.811)  Post-operative diagnosis: Visually significant age-related cataract, Right Eye  Procedure: Removal of cataract via phacoemulsification and insertion of intra-ocular lens [employer] PCB00  +19.0D into the capsular bag of the Right Eye  Attending surgeon: Anden Bartolo A. Mara Favero, MD, MA  Anesthesia: MAC, Topical Akten  Complications: None  Estimated Blood Loss: <1mL (minimal)  Specimens: None  Implants: As above  Indications:  Visually significant age-related cataract, Right Eye  Procedure:  The patient was seen and identified in the pre-operative area. The operative eye was identified and dilated.  The operative eye was marked.  Topical anesthesia was administered to the operative eye.     The patient was then to the operative suite and placed in the supine position.  A timeout was performed confirming the patient, procedure to be performed, and all other relevant information.   The patient's face was prepped and draped in the usual fashion for intra-ocular surgery.  A lid speculum was placed into the operative eye and the surgical microscope moved into place and focused.  A superotemporal paracentesis was created using a 20 gauge paracentesis blade.  Shugarcaine was injected into the anterior chamber.  Viscoelastic was injected into the anterior chamber.  A temporal clear-corneal main wound incision was created using a 2.4mm microkeratome.  A continuous curvilinear capsulorrhexis was initiated using an irrigating cystitome and completed using capsulorrhexis forceps.  Hydrodissection and hydrodeliniation were performed.  Viscoelastic was injected into the anterior chamber.  A phacoemulsification handpiece and a chopper as a second instrument were used to remove the nucleus and epinucleus. The irrigation/aspiration handpiece was used to remove any remaining cortical  material.   The capsular bag was reinflated with viscoelastic, checked, and found to be intact.  The intraocular lens was inserted into the capsular bag and dialed into place using a Kuglen hook.  The irrigation/aspiration handpiece was used to remove any remaining viscoelastic.  The clear corneal wound and paracentesis wounds were then hydrated and checked with Weck-Cels to be watertight.  The lid-speculum and drape was removed, and the patient's face was cleaned with a wet and dry 4x4.  Maxitrol was instilled in the eye before a clear shield was taped over the eye. The patient was taken to the post-operative care unit in good condition, having tolerated the procedure well.  Post-Op Instructions: The patient will follow up at [facility] for a same day post-operative evaluation and will receive all other orders and instructions. 

## 2019-07-17 NOTE — Discharge Instructions (Signed)
Please discharge patient when stable, will follow up today with Dr. Oriana Horiuchi at the [facility] office immediately following discharge.  Leave shield in place until visit.  All paperwork with discharge instructions will be given at the office. ° °

## 2019-07-17 NOTE — Transfer of Care (Signed)
Immediate Anesthesia Transfer of Care Note  Patient: Jack Gilbert  Procedure(s) Performed: CATARACT EXTRACTION PHACO AND INTRAOCULAR LENS PLACEMENT (IOC) (Right Eye)  Patient Location: Short Stay  Anesthesia Type:MAC  Level of Consciousness: awake and patient cooperative  Airway & Oxygen Therapy: Patient Spontanous Breathing  Post-op Assessment: Report given to RN, Post -op Vital signs reviewed and stable and Patient moving all extremities  Post vital signs: Reviewed and stable  Last Vitals:  Vitals Value Taken Time  BP    Temp    Pulse    Resp    SpO2      Last Pain:  Vitals:   07/17/19 0857  TempSrc: Oral  PainSc: 0-No pain         Complications: No apparent anesthesia complications

## 2019-07-18 ENCOUNTER — Encounter (HOSPITAL_COMMUNITY): Payer: Self-pay | Admitting: Ophthalmology

## 2019-08-12 ENCOUNTER — Other Ambulatory Visit (HOSPITAL_COMMUNITY): Payer: BC Managed Care – PPO

## 2019-08-15 ENCOUNTER — Ambulatory Visit: Admit: 2019-08-15 | Payer: BC Managed Care – PPO | Admitting: Ophthalmology

## 2019-08-15 SURGERY — PHACOEMULSIFICATION, CATARACT, WITH IOL INSERTION
Anesthesia: Monitor Anesthesia Care | Laterality: Left

## 2019-09-07 DIAGNOSIS — Z23 Encounter for immunization: Secondary | ICD-10-CM | POA: Diagnosis not present

## 2019-10-24 ENCOUNTER — Other Ambulatory Visit: Payer: Self-pay | Admitting: Family Medicine

## 2019-10-27 NOTE — Telephone Encounter (Signed)
Needs virtual visit may have 1 refill

## 2019-10-28 NOTE — Telephone Encounter (Signed)
Tried to call pt vm box not set up unable to leave message

## 2019-10-30 NOTE — Telephone Encounter (Signed)
Pt has phone visit scheduled for 11/17/2019 - please send in refill

## 2019-11-17 ENCOUNTER — Other Ambulatory Visit: Payer: Self-pay

## 2019-11-17 ENCOUNTER — Ambulatory Visit (INDEPENDENT_AMBULATORY_CARE_PROVIDER_SITE_OTHER): Payer: BC Managed Care – PPO | Admitting: Family Medicine

## 2019-11-17 DIAGNOSIS — R002 Palpitations: Secondary | ICD-10-CM

## 2019-11-17 DIAGNOSIS — Z13220 Encounter for screening for lipoid disorders: Secondary | ICD-10-CM

## 2019-11-17 DIAGNOSIS — F411 Generalized anxiety disorder: Secondary | ICD-10-CM | POA: Diagnosis not present

## 2019-11-17 DIAGNOSIS — Z79899 Other long term (current) drug therapy: Secondary | ICD-10-CM

## 2019-11-17 MED ORDER — CITALOPRAM HYDROBROMIDE 20 MG PO TABS: ORAL_TABLET | ORAL | 12 refills | Status: DC

## 2019-11-17 MED ORDER — METOPROLOL SUCCINATE ER 100 MG PO TB24: ORAL_TABLET | ORAL | 12 refills | Status: DC

## 2019-11-17 NOTE — Progress Notes (Signed)
   Subjective:    Patient ID: Jack Gilbert, male    DOB: Jan 12, 1956, 63 y.o.   MRN: [id number]  HPI Pt here for med check. Pt states he has no issues or concerns. Pt taking Celexa 20 mg daily and Metoprolol 100 mg.  Patient does try to watch salt in his diet.  Stays physically active.  Takes his medication on a regular basis.  States his stress levels overall doing well moods are doing well Celexa helping would like to continue.  PMH benign Virtual Visit via Telephone Note  I connected with Jack Gilbert on 11/17/19 at  8:30 AM EST by telephone and verified that I am speaking with the correct person using two identifiers.  Location: Patient: home Provider: office   I discussed the limitations, risks, security and privacy concerns of performing an evaluation and management service by telephone and the availability of in person appointments. I also discussed with the patient that there may be a patient responsible charge related to this service. The patient expressed understanding and agreed to proceed.   History of Present Illness:    Observations/Objective:   Assessment and Plan:   Follow Up Instructions:    I discussed the assessment and treatment plan with the patient. The patient was provided an opportunity to ask questions and all were answered. The patient agreed with the plan and demonstrated an understanding of the instructions.   The patient was advised to call back or seek an in-person evaluation if the symptoms worsen or if the condition fails to improve as anticipated.  I provided 16 with minutes of non-face-to-face time during this encounter.   Vicente Males, LPN    Review of Systems  Constitutional: Negative for diaphoresis and fatigue.  HENT: Negative for congestion and rhinorrhea.   Respiratory: Negative for cough and shortness of breath.   Cardiovascular: Negative for chest pain and leg swelling.  Gastrointestinal: Negative for abdominal pain and  diarrhea.  Skin: Negative for color change and rash.  Neurological: Negative for dizziness and headaches.  Psychiatric/Behavioral: Negative for behavioral problems and confusion.       Objective:   Physical Exam  Today's visit was via telephone Physical exam was not possible for this visit       Assessment & Plan:  Palpitations-overall reasonably good control with metoprolol he takes it on a regular basis tries to eat healthy  Patient has history of elevated PSA he is followed by [facility].  He states he will connect with them regarding that  Colonoscopy due in the spring 2021 Wellness exam recommended by the spring 2021  Generalized anxiety is disorder overall doing well with stress despite all of what is going on with the pandemic continue citalopram doing well with medicine requests refills  Refills given for 1 year wellness later in the spring otherwise follow-up in a year

## 2019-11-24 DIAGNOSIS — B0089 Other herpesviral infection: Secondary | ICD-10-CM | POA: Diagnosis not present

## 2019-12-05 DIAGNOSIS — N400 Enlarged prostate without lower urinary tract symptoms: Secondary | ICD-10-CM | POA: Diagnosis not present

## 2019-12-05 DIAGNOSIS — Z125 Encounter for screening for malignant neoplasm of prostate: Secondary | ICD-10-CM | POA: Diagnosis not present

## 2019-12-15 DIAGNOSIS — N5201 Erectile dysfunction due to arterial insufficiency: Secondary | ICD-10-CM | POA: Diagnosis not present

## 2019-12-15 DIAGNOSIS — R972 Elevated prostate specific antigen [PSA]: Secondary | ICD-10-CM | POA: Diagnosis not present

## 2020-02-10 ENCOUNTER — Encounter: Payer: Self-pay | Admitting: Internal Medicine

## 2020-05-02 ENCOUNTER — Ambulatory Visit
Admission: EM | Admit: 2020-05-02 | Discharge: 2020-05-02 | Disposition: A | Discharge status: Home / Self Care | Payer: BC Managed Care – PPO | Attending: Emergency Medicine | Admitting: Emergency Medicine

## 2020-05-02 ENCOUNTER — Other Ambulatory Visit: Payer: Self-pay

## 2020-05-02 ENCOUNTER — Encounter: Payer: Self-pay | Admitting: Emergency Medicine

## 2020-05-02 DIAGNOSIS — H6123 Impacted cerumen, bilateral: Secondary | ICD-10-CM | POA: Diagnosis not present

## 2020-05-02 DIAGNOSIS — H6592 Unspecified nonsuppurative otitis media, left ear: Secondary | ICD-10-CM | POA: Diagnosis not present

## 2020-05-02 MED ORDER — CARBAMIDE PEROXIDE 6.5 % OT SOLN
5.0000 [drp] | Freq: Two times a day (BID) | OTIC | 0 refills | Status: DC
Start: 2020-05-02 — End: 2020-12-10

## 2020-05-02 MED ORDER — FLUTICASONE PROPIONATE 50 MCG/ACT NA SUSP
1.0000 | Freq: Every day | NASAL | 0 refills | Status: DC
Start: 2020-05-02 — End: 2020-12-10

## 2020-05-02 NOTE — Discharge Instructions (Addendum)
Rest and drink plenty of fluids Flonase was prescribed for  middle ear effusion Debrox prescribed for impacted earwax Take medications as directed and to completion Continue to use OTC ibuprofen and/ or tylenol as needed for pain control Follow up with PCP if symptoms persists Return here or go to the ER if you have any new or worsening symptoms
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
## 2020-05-02 NOTE — ED Triage Notes (Signed)
Both ears feel stopped up.

## 2020-11-18 ENCOUNTER — Other Ambulatory Visit: Payer: Self-pay | Admitting: Family Medicine

## 2020-11-25 NOTE — Telephone Encounter (Signed)
Called twice mail box not setup and no answer

## 2020-11-25 NOTE — Telephone Encounter (Signed)
Please try 3 different days before we can close. thanks

## 2020-11-29 ENCOUNTER — Other Ambulatory Visit: Payer: Self-pay | Admitting: *Deleted

## 2020-11-29 DIAGNOSIS — N400 Enlarged prostate without lower urinary tract symptoms: Secondary | ICD-10-CM | POA: Diagnosis not present

## 2020-12-02 NOTE — Telephone Encounter (Signed)
This was my third time calling patient to set up appointment mail box not setup and he is not answering

## 2020-12-03 ENCOUNTER — Other Ambulatory Visit: Payer: Self-pay | Admitting: Family Medicine

## 2020-12-10 ENCOUNTER — Ambulatory Visit (INDEPENDENT_AMBULATORY_CARE_PROVIDER_SITE_OTHER): Payer: BC Managed Care – PPO | Admitting: Family Medicine

## 2020-12-10 ENCOUNTER — Encounter: Payer: Self-pay | Admitting: Family Medicine

## 2020-12-10 VITALS — BP 128/68 | HR 89 | Temp 97.4°F | Wt 199.0 lb

## 2020-12-10 DIAGNOSIS — I10 Essential (primary) hypertension: Secondary | ICD-10-CM

## 2020-12-10 DIAGNOSIS — F411 Generalized anxiety disorder: Secondary | ICD-10-CM | POA: Diagnosis not present

## 2020-12-10 MED ORDER — METOPROLOL SUCCINATE ER 100 MG PO TB24: ORAL_TABLET | ORAL | 12 refills | Status: DC

## 2020-12-10 MED ORDER — CITALOPRAM HYDROBROMIDE 20 MG PO TABS: ORAL_TABLET | ORAL | 12 refills | Status: DC

## 2020-12-10 NOTE — Progress Notes (Signed)
Patient ID: WOODFIN KISS, male    DOB: 08/28/1956, 65 y.o.   MRN: [id number]   Chief Complaint  Patient presents with  . Hypertension    Patient following up for hypertension.  Patient mentions joint pain/arthritis bothering him more often lately.  Patient also reports sinus drainage x 3 weeks. No fever/no other symptoms.    Subjective:  CC: medication management for HTN and GAD  Presents today for medication management for hypertension and general anxiety disorder.  Has not had labs drawn since 2015, extensive discussion concerning the importance of this.  Denies fever, chills, chest pain, shortness of breath, any significant concerns with health.  He is seeing a urologist this next Tuesday for prostate issues.  He declines colonoscopy at this time, does not want to get labs drawn at this time.  Paperwork will be given today for this.  Reports that he has been off of his metoprolol for the last week week and a half, due to needing this visit today.  Reports that he takes his anxiety medication on most days, occasionally misses it.    Medical History [place] has a past medical history of Anxiety, Chronic back pain, Gallbladder polyp, GERD (gastroesophageal reflux disease), Hiatal hernia, Hyperlipidemia, Palpitations, and Prostatitis.   Outpatient Encounter Medications as of 12/10/2020  Medication Sig  . citalopram (CELEXA) 20 MG tablet TAKE 1 TABLET BY MOUTH ONCE DAILY  . desonide (DESOWEN) 0.05 % cream Apply thin amount twice daily prn  . metoprolol succinate (TOPROL-XL) 100 MG 24 hr tablet TAKE 1 TABLET BY MOUTH ONCE DAILY WITH  OR  IMMEDIATELY  FOLLOWING  A  MEAL  . triamcinolone cream (KENALOG) 0.1 % Apply 1 application topically 2 (two) times daily as needed.  . [DISCONTINUED] carbamide peroxide (DEBROX) 6.5 % OTIC solution Place 5 drops into both ears 2 (two) times daily.  . [DISCONTINUED] fluticasone (FLONASE) 50 MCG/ACT nasal spray Place 1 spray into both nostrils daily for  14 days.   No facility-administered encounter medications on file as of 12/10/2020.     Review of Systems  Constitutional: Negative for chills and fever.  HENT: Positive for congestion and postnasal drip. Negative for ear pain and sore throat.   Eyes: Negative for pain.  Respiratory: Positive for cough. Negative for shortness of breath.   Cardiovascular: Negative for chest pain.  Gastrointestinal: Negative for abdominal pain, diarrhea, nausea and vomiting.  Musculoskeletal: Positive for arthralgias. Negative for myalgias.       Shoulder pain worse at night. No known injury. Strenuous occupation in past.   Neurological: Negative for dizziness, weakness and headaches.  Psychiatric/Behavioral: Negative for self-injury and suicidal ideas.     Vitals BP 128/68   Pulse 89   Temp (!) 97.4 F (36.3 C)   Wt 199 lb (90.3 kg)   SpO2 100%   BMI 28.55 kg/m   Objective:   Physical Exam Vitals reviewed.  HENT:     Nose:     Right Sinus: No maxillary sinus tenderness or frontal sinus tenderness.     Left Sinus: No maxillary sinus tenderness or frontal sinus tenderness.  Cardiovascular:     Rate and Rhythm: Normal rate and regular rhythm.     Heart sounds: Normal heart sounds.  Pulmonary:     Effort: Pulmonary effort is normal.     Breath sounds: Normal breath sounds.  Skin:    General: Skin is warm and dry.  Neurological:     General: No focal deficit present.  Mental Status: He is alert.  Psychiatric:        Behavior: Behavior normal.      Assessment and Plan   1. Essential hypertension  2. GAD (generalized anxiety disorder)   Hypertension Medication compliance: takes Metoprolol for heart palpitations, been off since 1- 1.5 weeks ago.  Denies chest pain, shortness of breath, lower extremity edema, vision changes, headaches.  Pertinent lab work: due: last done 2015: has not been compliant with getting labs done (ordered not done). Discussion today concerning risk of  not having this data.  ASCVD 10- year risk score: not performed Monitoring: does not monitor blood pressure at home, discussed that getting labs at least once per year to discover major organ function.  Continue current medication regimen: Has been off of BB for over one week- heart rate regular today in office.   Generalized anxiety:   Medication compliance: taking mostly as prescribed (forgets it occasionally) Pertinent lab work: due Monitoring: wants to continue. Denies thoughts of self- harm, suicide. For most part feels okay.  Continue current medication regimen: wishes to continue taking.   Agrees with plan of care discussed today. Understands warning signs to seek further care: cheat pain, shortness of breath, any changes in health. Please get labs drawn soon.  Understands to follow-up in one year, sooner if needed. Gave paperwork to get CMP drawn. Strongly encouraged him to do so. Major organ damage is a serious complication of high blood pressure.  Encouraged nasal saline flushes for sinus drainage.  Reports left shoulder pain, does not want to see orthopedics currently.  Pecolia Ades, FNP-C

## 2020-12-10 NOTE — Patient Instructions (Addendum)
Hi Tim-Hope you are doing well.  I did get to speak with our nurse practitioner regarding your case.  Continue to stay healthy.  By all means please bring time for your blood work-it only takes a few minutes.  This needs to be completed ideally within the next few weeks.  This is a important part of your healthcare.  Thanks-Dr. Nicki Reaper Sinusitis, Adult Sinusitis is soreness and swelling (inflammation) of your sinuses. Sinuses are hollow spaces in the bones around your face. They are located:  Around your eyes.  In the middle of your forehead.  Behind your nose.  In your cheekbones. Your sinuses and nasal passages are lined with a fluid called mucus. Mucus drains out of your sinuses. Swelling can trap mucus in your sinuses. This lets germs (bacteria, virus, or fungus) grow, which leads to infection. Most of the time, this condition is caused by a virus. What are the causes? This condition is caused by:  Allergies.  Asthma.  Germs.  Things that block your nose or sinuses.  Growths in the nose (nasal polyps).  Chemicals or irritants in the air.  Fungus (rare). What increases the risk? You are more likely to develop this condition if:  You have a weak body defense system (immune system).  You do a lot of swimming or diving.  You use nasal sprays too much.  You smoke. What are the signs or symptoms? The main symptoms of this condition are pain and a feeling of pressure around the sinuses. Other symptoms include:  Stuffy nose (congestion).  Runny nose (drainage).  Swelling and warmth in the sinuses.  Headache.  Toothache.  A cough that may get worse at night.  Mucus that collects in the throat or the back of the nose (postnasal drip).  Being unable to smell and taste.  Being very tired (fatigue).  A fever.  Sore throat.  Bad breath. How is this diagnosed? This condition is diagnosed based on:  Your symptoms.  Your medical history.  A physical  exam.  Tests to find out if your condition is short-term (acute) or long-term (chronic). Your doctor may: ? Check your nose for growths (polyps). ? Check your sinuses using a tool that has a light (endoscope). ? Check for allergies or germs. ? Do imaging tests, such as an MRI or CT scan. How is this treated? Treatment for this condition depends on the cause and whether it is short-term or long-term.  If caused by a virus, your symptoms should go away on their own within 10 days. You may be given medicines to relieve symptoms. They include: ? Medicines that shrink swollen tissue in the nose. ? Medicines that treat allergies (antihistamines). ? A spray that treats swelling of the nostrils. ? Rinses that help get rid of thick mucus in your nose (nasal saline washes).  If caused by bacteria, your doctor may wait to see if you will get better without treatment. You may be given antibiotic medicine if you have: ? A very bad infection. ? A weak body defense system.  If caused by growths in the nose, you may need to have surgery. Follow these instructions at home: Medicines  Take, use, or apply over-the-counter and prescription medicines only as told by your doctor. These may include nasal sprays.  If you were prescribed an antibiotic medicine, take it as told by your doctor. Do not stop taking the antibiotic even if you start to feel better. Hydrate and humidify  Drink enough water to  keep your pee (urine) pale yellow.  Use a cool mist humidifier to keep the humidity level in your home above 50%.  Breathe in steam for 10-15 minutes, 3-4 times a day, or as told by your doctor. You can do this in the bathroom while a hot shower is running.  Try not to spend time in cool or dry air.   Rest  Rest as much as you can.  Sleep with your head raised (elevated).  Make sure you get enough sleep each night. General instructions  Put a warm, moist washcloth on your face 3-4 times a day, or  as often as told by your doctor. This will help with discomfort.  Wash your hands often with soap and water. If there is no soap and water, use hand sanitizer.  Do not smoke. Avoid being around people who are smoking (secondhand smoke).  Keep all follow-up visits as told by your doctor. This is important.   Contact a doctor if:  You have a fever.  Your symptoms get worse.  Your symptoms do not get better within 10 days. Get help right away if:  You have a very bad headache.  You cannot stop throwing up (vomiting).  You have very bad pain or swelling around your face or eyes.  You have trouble seeing.  You feel confused.  Your neck is stiff.  You have trouble breathing. Summary  Sinusitis is swelling of your sinuses. Sinuses are hollow spaces in the bones around your face.  This condition is caused by tissues in your nose that become inflamed or swollen. This traps germs. These can lead to infection.  If you were prescribed an antibiotic medicine, take it as told by your doctor. Do not stop taking it even if you start to feel better.  Keep all follow-up visits as told by your doctor. This is important. This information is not intended to replace advice given to you by your health care provider. Make sure you discuss any questions you have with your health care provider. Document Revised: 04/15/2018 Document Reviewed: 04/15/2018 Elsevier Patient Education  2021 [employer].

## 2021-01-24 DIAGNOSIS — N5201 Erectile dysfunction due to arterial insufficiency: Secondary | ICD-10-CM | POA: Diagnosis not present

## 2021-01-24 DIAGNOSIS — N401 Enlarged prostate with lower urinary tract symptoms: Secondary | ICD-10-CM | POA: Diagnosis not present

## 2021-01-24 DIAGNOSIS — R3915 Urgency of urination: Secondary | ICD-10-CM | POA: Diagnosis not present

## 2021-05-19 DIAGNOSIS — M720 Palmar fascial fibromatosis [Dupuytren]: Secondary | ICD-10-CM | POA: Diagnosis not present

## 2021-05-19 DIAGNOSIS — M65331 Trigger finger, right middle finger: Secondary | ICD-10-CM | POA: Diagnosis not present

## 2021-05-19 DIAGNOSIS — M79642 Pain in left hand: Secondary | ICD-10-CM | POA: Diagnosis not present

## 2021-05-19 DIAGNOSIS — M79641 Pain in right hand: Secondary | ICD-10-CM | POA: Diagnosis not present

## 2021-06-24 DIAGNOSIS — M9902 Segmental and somatic dysfunction of thoracic region: Secondary | ICD-10-CM | POA: Diagnosis not present

## 2021-06-24 DIAGNOSIS — M4124 Other idiopathic scoliosis, thoracic region: Secondary | ICD-10-CM | POA: Diagnosis not present

## 2021-06-24 DIAGNOSIS — M9903 Segmental and somatic dysfunction of lumbar region: Secondary | ICD-10-CM | POA: Diagnosis not present

## 2021-06-24 DIAGNOSIS — M9905 Segmental and somatic dysfunction of pelvic region: Secondary | ICD-10-CM | POA: Diagnosis not present

## 2021-07-01 DIAGNOSIS — M9903 Segmental and somatic dysfunction of lumbar region: Secondary | ICD-10-CM | POA: Diagnosis not present

## 2021-07-01 DIAGNOSIS — M9902 Segmental and somatic dysfunction of thoracic region: Secondary | ICD-10-CM | POA: Diagnosis not present

## 2021-07-01 DIAGNOSIS — M4124 Other idiopathic scoliosis, thoracic region: Secondary | ICD-10-CM | POA: Diagnosis not present

## 2021-07-01 DIAGNOSIS — M9905 Segmental and somatic dysfunction of pelvic region: Secondary | ICD-10-CM | POA: Diagnosis not present

## 2021-07-08 DIAGNOSIS — M4124 Other idiopathic scoliosis, thoracic region: Secondary | ICD-10-CM | POA: Diagnosis not present

## 2021-07-08 DIAGNOSIS — M9903 Segmental and somatic dysfunction of lumbar region: Secondary | ICD-10-CM | POA: Diagnosis not present

## 2021-07-08 DIAGNOSIS — M9905 Segmental and somatic dysfunction of pelvic region: Secondary | ICD-10-CM | POA: Diagnosis not present

## 2021-07-08 DIAGNOSIS — M9902 Segmental and somatic dysfunction of thoracic region: Secondary | ICD-10-CM | POA: Diagnosis not present

## 2021-07-15 DIAGNOSIS — M9902 Segmental and somatic dysfunction of thoracic region: Secondary | ICD-10-CM | POA: Diagnosis not present

## 2021-07-15 DIAGNOSIS — M9905 Segmental and somatic dysfunction of pelvic region: Secondary | ICD-10-CM | POA: Diagnosis not present

## 2021-07-15 DIAGNOSIS — M9903 Segmental and somatic dysfunction of lumbar region: Secondary | ICD-10-CM | POA: Diagnosis not present

## 2021-07-15 DIAGNOSIS — M4124 Other idiopathic scoliosis, thoracic region: Secondary | ICD-10-CM | POA: Diagnosis not present

## 2021-07-29 DIAGNOSIS — M9905 Segmental and somatic dysfunction of pelvic region: Secondary | ICD-10-CM | POA: Diagnosis not present

## 2021-07-29 DIAGNOSIS — M4124 Other idiopathic scoliosis, thoracic region: Secondary | ICD-10-CM | POA: Diagnosis not present

## 2021-07-29 DIAGNOSIS — M9903 Segmental and somatic dysfunction of lumbar region: Secondary | ICD-10-CM | POA: Diagnosis not present

## 2021-07-29 DIAGNOSIS — M9902 Segmental and somatic dysfunction of thoracic region: Secondary | ICD-10-CM | POA: Diagnosis not present

## 2021-07-30 ENCOUNTER — Other Ambulatory Visit: Payer: Self-pay

## 2021-07-30 ENCOUNTER — Ambulatory Visit
Admission: EM | Admit: 2021-07-30 | Discharge: 2021-07-30 | Disposition: A | Discharge status: Home / Self Care | Payer: BC Managed Care – PPO | Attending: Internal Medicine | Admitting: Internal Medicine

## 2021-07-30 ENCOUNTER — Encounter: Payer: Self-pay | Admitting: Emergency Medicine

## 2021-07-30 DIAGNOSIS — J019 Acute sinusitis, unspecified: Secondary | ICD-10-CM

## 2021-07-30 DIAGNOSIS — U071 COVID-19: Secondary | ICD-10-CM

## 2021-07-30 DIAGNOSIS — B9789 Other viral agents as the cause of diseases classified elsewhere: Secondary | ICD-10-CM

## 2021-07-30 DIAGNOSIS — Z1152 Encounter for screening for COVID-19: Secondary | ICD-10-CM

## 2021-07-30 MED ORDER — GUAIFENESIN ER 600 MG PO TB12
600.0000 mg | ORAL_TABLET | Freq: Two times a day (BID) | ORAL | 0 refills | Status: AC
Start: 2021-07-30 — End: 2021-08-14

## 2021-07-30 MED ORDER — SALINE SPRAY 0.65 % NA SOLN: 1.0000 | NASAL | 0 refills | Status: AC | PRN

## 2021-07-30 MED ORDER — MOLNUPIRAVIR EUA 200MG CAPSULE: 4.0000 | ORAL_CAPSULE | Freq: Two times a day (BID) | ORAL | 0 refills | Status: AC

## 2021-07-30 MED ORDER — FLUTICASONE PROPIONATE 50 MCG/ACT NA SUSP
1.0000 | Freq: Every day | NASAL | 0 refills | Status: DC
Start: 2021-07-30 — End: 2023-01-11

## 2021-07-30 NOTE — ED Triage Notes (Signed)
Sinus pressure and runny nose since Thursday.  Green mucus - nasal congestion.   Took home coivd test today that was positive.  States test had expired a month ago.

## 2021-07-30 NOTE — Discharge Instructions (Addendum)
Please take medications as prescribed Your quarantine ends on Tuesday, 08/03/2021.  Please wear N95 mask for 5 extra days and then 08/08/2021 If you have any worsening symptoms please return to urgent care to be reevaluated.
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
## 2021-08-05 DIAGNOSIS — M4124 Other idiopathic scoliosis, thoracic region: Secondary | ICD-10-CM | POA: Diagnosis not present

## 2021-08-05 DIAGNOSIS — M9905 Segmental and somatic dysfunction of pelvic region: Secondary | ICD-10-CM | POA: Diagnosis not present

## 2021-08-05 DIAGNOSIS — M9903 Segmental and somatic dysfunction of lumbar region: Secondary | ICD-10-CM | POA: Diagnosis not present

## 2021-08-05 DIAGNOSIS — M9902 Segmental and somatic dysfunction of thoracic region: Secondary | ICD-10-CM | POA: Diagnosis not present

## 2021-08-19 DIAGNOSIS — M9903 Segmental and somatic dysfunction of lumbar region: Secondary | ICD-10-CM | POA: Diagnosis not present

## 2021-08-19 DIAGNOSIS — M9902 Segmental and somatic dysfunction of thoracic region: Secondary | ICD-10-CM | POA: Diagnosis not present

## 2021-08-19 DIAGNOSIS — M9905 Segmental and somatic dysfunction of pelvic region: Secondary | ICD-10-CM | POA: Diagnosis not present

## 2021-08-19 DIAGNOSIS — M4124 Other idiopathic scoliosis, thoracic region: Secondary | ICD-10-CM | POA: Diagnosis not present

## 2021-08-26 DIAGNOSIS — N410 Acute prostatitis: Secondary | ICD-10-CM | POA: Diagnosis not present

## 2021-08-31 ENCOUNTER — Other Ambulatory Visit: Payer: Self-pay

## 2021-08-31 ENCOUNTER — Ambulatory Visit: Payer: BC Managed Care – PPO | Admitting: Family Medicine

## 2021-08-31 VITALS — BP 138/88 | HR 62 | Temp 97.9°F | Ht 70.0 in | Wt 197.0 lb

## 2021-08-31 DIAGNOSIS — Z23 Encounter for immunization: Secondary | ICD-10-CM

## 2021-08-31 DIAGNOSIS — R350 Frequency of micturition: Secondary | ICD-10-CM | POA: Diagnosis not present

## 2021-08-31 DIAGNOSIS — E785 Hyperlipidemia, unspecified: Secondary | ICD-10-CM | POA: Diagnosis not present

## 2021-08-31 DIAGNOSIS — R102 Pelvic and perineal pain: Secondary | ICD-10-CM | POA: Diagnosis not present

## 2021-08-31 DIAGNOSIS — N401 Enlarged prostate with lower urinary tract symptoms: Secondary | ICD-10-CM | POA: Diagnosis not present

## 2021-08-31 NOTE — Progress Notes (Signed)
   Subjective:    Patient ID: Jack Gilbert, male    DOB: November 05, 1956, 65 y.o.   MRN: [id number]  HPI Prostate concerns-  nner thighs soreness, pelvic pressure off and on , weak stream- has urology Patient relates having some intermittent lower pelvic pain and some thigh pain denies any dysuria.  Recently saw urology his PSA look normal Patient states he had red where taking Valtrex can suppress the PSA He is concerned that there is a possibility of prostate cancer and that this is being overlooked.  We had a open discussion today regarding this  Benign prostatic hyperplasia with urinary frequency - Plan: Lipid panel, Basic metabolic panel, Hepatic function panel, Ambulatory referral to Urology  Pelvic pain - Plan: Lipid panel, Basic metabolic panel, Hepatic function panel  Hyperlipidemia, unspecified hyperlipidemia type - Plan: Lipid panel, Basic metabolic panel, Hepatic function panel  Immunization due - Plan: Flu Vaccine QUAD 60mo+IM (Fluarix, Fluzone & Alfiuria Quad PF)  Patient is due for blood work as well   Review of Systems     Objective:   Physical Exam  Lungs are clear heart regular pulse normal respiratory rate normal blood pressure borderline prostate moderate enlargement soft      Assessment & Plan:  1. Benign prostatic hyperplasia with urinary frequency Given the ongoing lower pelvic pain and discomfort into the legs plus also history of elevated PSA will go ahead and get second opinion with [facility]. Patient raises issue that Valtrex can suppress PSA but I am not as familiar with this we will look into this - Lipid panel - Basic metabolic panel - Hepatic function panel - Ambulatory referral to Urology  2. Pelvic pain If [facility] check smell and does not feel that this is his prostate then consideration for work-up for nerve impingement in the back as well as physical therapy - Lipid panel - Basic metabolic panel - Hepatic function  panel  3. Hyperlipidemia, unspecified hyperlipidemia type Labs ordered await results - Lipid panel - Basic metabolic panel - Hepatic function panel  4. Immunization due Given today - Flu Vaccine QUAD 66mo+IM (Fluarix, Fluzone & Alfiuria Quad PF)

## 2021-08-31 NOTE — Patient Instructions (Signed)

## 2021-09-01 ENCOUNTER — Encounter: Payer: Self-pay | Admitting: Family Medicine

## 2021-09-01 LAB — HEPATIC FUNCTION PANEL
ALT: 37 IU/L (ref 0–44)
AST: 26 IU/L (ref 0–40)
Albumin: 4.9 g/dL — ABNORMAL HIGH (ref 3.8–4.8)
Alkaline Phosphatase: 74 IU/L (ref 44–121)
Bilirubin Total: 0.7 mg/dL (ref 0.0–1.2)
Bilirubin, Direct: 0.17 mg/dL (ref 0.00–0.40)
Total Protein: 6.9 g/dL (ref 6.0–8.5)

## 2021-09-01 LAB — BASIC METABOLIC PANEL
BUN/Creatinine Ratio: 15 (ref 10–24)
BUN: 12 mg/dL (ref 8–27)
CO2: 23 mmol/L (ref 20–29)
Calcium: 9.7 mg/dL (ref 8.6–10.2)
Chloride: 100 mmol/L (ref 96–106)
Creatinine, Ser: 0.81 mg/dL (ref 0.76–1.27)
Glucose: 104 mg/dL — ABNORMAL HIGH (ref 70–99)
Potassium: 4.9 mmol/L (ref 3.5–5.2)
Sodium: 139 mmol/L (ref 134–144)
eGFR: 100 mL/min/{1.73_m2} (ref >59)

## 2021-09-01 LAB — LIPID PANEL
Chol/HDL Ratio: 4.1 ratio (ref 0.0–5.0)
Cholesterol, Total: 194 mg/dL (ref 100–199)
HDL: 47 mg/dL (ref >39)
LDL Chol Calc (NIH): 133 mg/dL — ABNORMAL HIGH (ref 0–99)
Triglycerides: 76 mg/dL (ref 0–149)
VLDL Cholesterol Cal: 14 mg/dL (ref 5–40)

## 2021-09-05 ENCOUNTER — Telehealth: Payer: Self-pay | Admitting: Family Medicine

## 2021-09-05 DIAGNOSIS — N401 Enlarged prostate with lower urinary tract symptoms: Secondary | ICD-10-CM

## 2021-09-05 DIAGNOSIS — R972 Elevated prostate specific antigen [PSA]: Secondary | ICD-10-CM

## 2021-09-05 DIAGNOSIS — F411 Generalized anxiety disorder: Secondary | ICD-10-CM

## 2021-09-05 NOTE — Telephone Encounter (Signed)
Patient would like results of labs , they were mailed out this morning but he would like to know if possible.

## 2021-09-05 NOTE — Telephone Encounter (Signed)
Letter read to patient and he verbalized understanding.   Patient stated he is still having the pain and pressure in pelvic area/inner thigh  Patient also stated he is having problems with is anxiety and feeling stressed out. Patient states years aog he was on a medication to take to help and would like to try something to help- something he can take to help the anxiety but he can still function normally.   [facility] in [place]

## 2021-09-06 ENCOUNTER — Encounter (HOSPITAL_COMMUNITY): Payer: Self-pay

## 2021-09-06 ENCOUNTER — Emergency Department (HOSPITAL_COMMUNITY)
Admission: EM | Admit: 2021-09-06 | Discharge: 2021-09-06 | Disposition: A | Discharge status: Home / Self Care | Payer: BC Managed Care – PPO | Attending: Emergency Medicine | Admitting: Emergency Medicine

## 2021-09-06 ENCOUNTER — Emergency Department (HOSPITAL_COMMUNITY): Payer: BC Managed Care – PPO

## 2021-09-06 ENCOUNTER — Other Ambulatory Visit: Payer: Self-pay

## 2021-09-06 DIAGNOSIS — M5126 Other intervertebral disc displacement, lumbar region: Secondary | ICD-10-CM | POA: Diagnosis not present

## 2021-09-06 DIAGNOSIS — R200 Anesthesia of skin: Secondary | ICD-10-CM | POA: Insufficient documentation

## 2021-09-06 DIAGNOSIS — F419 Anxiety disorder, unspecified: Secondary | ICD-10-CM | POA: Insufficient documentation

## 2021-09-06 DIAGNOSIS — Z87891 Personal history of nicotine dependence: Secondary | ICD-10-CM | POA: Diagnosis not present

## 2021-09-06 DIAGNOSIS — Z9049 Acquired absence of other specified parts of digestive tract: Secondary | ICD-10-CM | POA: Diagnosis not present

## 2021-09-06 DIAGNOSIS — Z79899 Other long term (current) drug therapy: Secondary | ICD-10-CM | POA: Insufficient documentation

## 2021-09-06 DIAGNOSIS — M5136 Other intervertebral disc degeneration, lumbar region: Secondary | ICD-10-CM

## 2021-09-06 DIAGNOSIS — R109 Unspecified abdominal pain: Secondary | ICD-10-CM | POA: Diagnosis not present

## 2021-09-06 DIAGNOSIS — I10 Essential (primary) hypertension: Secondary | ICD-10-CM | POA: Insufficient documentation

## 2021-09-06 DIAGNOSIS — M545 Low back pain, unspecified: Secondary | ICD-10-CM | POA: Diagnosis not present

## 2021-09-06 DIAGNOSIS — M549 Dorsalgia, unspecified: Secondary | ICD-10-CM

## 2021-09-06 DIAGNOSIS — I700 Atherosclerosis of aorta: Secondary | ICD-10-CM | POA: Diagnosis not present

## 2021-09-06 LAB — CBC WITH DIFFERENTIAL/PLATELET
Abs Immature Granulocytes: 0.02 10*3/uL (ref 0.00–0.07)
Basophils Absolute: 0.1 10*3/uL (ref 0.0–0.1)
Basophils Relative: 1 %
Eosinophils Absolute: 0.2 10*3/uL (ref 0.0–0.5)
Eosinophils Relative: 3 %
HCT: 44.1 % (ref 39.0–52.0)
Hemoglobin: 15.8 g/dL (ref 13.0–17.0)
Immature Granulocytes: 0 %
Lymphocytes Relative: 15 %
Lymphs Abs: 0.8 10*3/uL (ref 0.7–4.0)
MCH: 33.3 pg (ref 26.0–34.0)
MCHC: 35.8 g/dL (ref 30.0–36.0)
MCV: 92.8 fL (ref 80.0–100.0)
Monocytes Absolute: 0.5 10*3/uL (ref 0.1–1.0)
Monocytes Relative: 9 %
Neutro Abs: 3.7 10*3/uL (ref 1.7–7.7)
Neutrophils Relative %: 72 %
Platelets: 236 10*3/uL (ref 150–400)
RBC: 4.75 MIL/uL (ref 4.22–5.81)
RDW: 11.9 % (ref 11.5–15.5)
WBC: 5.3 10*3/uL (ref 4.0–10.5)
nRBC: 0 % (ref 0.0–0.2)

## 2021-09-06 LAB — COMPREHENSIVE METABOLIC PANEL
ALT: 37 U/L (ref 0–44)
AST: 24 U/L (ref 15–41)
Albumin: 4.3 g/dL (ref 3.5–5.0)
Alkaline Phosphatase: 56 U/L (ref 38–126)
Anion gap: 7 (ref 5–15)
BUN: 12 mg/dL (ref 8–23)
CO2: 24 mmol/L (ref 22–32)
Calcium: 8.7 mg/dL — ABNORMAL LOW (ref 8.9–10.3)
Chloride: 105 mmol/L (ref 98–111)
Creatinine, Ser: 0.7 mg/dL (ref 0.61–1.24)
GFR, Estimated: >60 mL/min (ref >60)
Glucose, Bld: 112 mg/dL — ABNORMAL HIGH (ref 70–99)
Potassium: 4.1 mmol/L (ref 3.5–5.1)
Sodium: 136 mmol/L (ref 135–145)
Total Bilirubin: 1.2 mg/dL (ref 0.3–1.2)
Total Protein: 7.2 g/dL (ref 6.5–8.1)

## 2021-09-06 LAB — URINALYSIS, ROUTINE W REFLEX MICROSCOPIC
Bilirubin Urine: NEGATIVE
Glucose, UA: NEGATIVE mg/dL
Hgb urine dipstick: NEGATIVE
Ketones, ur: NEGATIVE mg/dL
Leukocytes,Ua: NEGATIVE
Nitrite: NEGATIVE
Protein, ur: NEGATIVE mg/dL
Specific Gravity, Urine: 1.013 (ref 1.005–1.030)
pH: 6 (ref 5.0–8.0)

## 2021-09-06 LAB — PSA: Prostatic Specific Antigen: 2.43 ng/mL (ref 0.00–4.00)

## 2021-09-06 MED ORDER — KETOROLAC TROMETHAMINE 30 MG/ML IJ SOLN
30.0000 mg | Freq: Once | INTRAMUSCULAR | Status: AC
  Administered 2021-09-06: 30 mg via INTRAVENOUS
  Filled 2021-09-06: qty 1

## 2021-09-06 MED ORDER — PREDNISONE 10 MG (21) PO TBPK
ORAL_TABLET | Freq: Every day | ORAL | 0 refills | Status: DC
Start: 2021-09-06 — End: 2021-10-04

## 2021-09-06 MED ORDER — HYDROCODONE-ACETAMINOPHEN 5-325 MG PO TABS: 1.0000 | ORAL_TABLET | ORAL | 0 refills | Status: DC | PRN

## 2021-09-06 MED ORDER — LORAZEPAM 1 MG PO TABS
1.0000 mg | ORAL_TABLET | Freq: Two times a day (BID) | ORAL | 0 refills | Status: DC | PRN
Start: 2021-09-06 — End: 2021-09-13

## 2021-09-06 NOTE — Telephone Encounter (Signed)
Nurses Based upon previous visit The purpose of the lab work was to make sure we did not see any unusual findings that would point towards something else.  As the patient discussed with myself as well as the discussion we had together the neck step [facility].  If they do not feel that this is causing his lower pelvic pain or pain into his legs then the next step would be is to look at the lower back. After the patient sees [facility] it would be wise for him to follow-up with [place]  As for stress and anxiety in the past he was on the medication called Celexa which is a antidepressant that also can help with stress and anxiety.  This medication is typically started on a low-dose and is generally well-tolerated.  it will not immediately help his symptoms right away but over the course of the next few weeks he should see some benefit.  If he is interested in starting this we can start at a low-dose and do a follow-up visit in approximately 4 weeks.  It should be noted that the first week its not unusual to have some nausea but this typically goes away after that  If he would rather have a sitdown visit to discuss these issues we can do that anywhere within the next 2 weeks thank you

## 2021-09-06 NOTE — ED Triage Notes (Signed)
Pt complains of lower back pain, pelvic pain, and leg weakness. Pt states leg weakness has gotten worse over the last couple days. Pt states that he has a hx of enlarged prostate.

## 2021-09-06 NOTE — Telephone Encounter (Signed)
Telephone call-voicemail not set up ?

## 2021-09-06 NOTE — ED Notes (Signed)
EDP at bedside  
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
## 2021-09-08 ENCOUNTER — Telehealth: Payer: Self-pay | Admitting: Family Medicine

## 2021-09-08 NOTE — Telephone Encounter (Signed)
Pt contacted and verbalized understanding. Referral placed to have second opinion regarding prostate. Pt went to ER 09/06/21 and diagnosed with bulging lumbar disc. Also had scans done and pt started on Prednisone.  Pt would like to try Celexa. Pt using [facility]. Pt verbalized understanding regarding symptoms. Please advise. Thank you

## 2021-09-08 NOTE — Telephone Encounter (Signed)
CV other message, I reviewed over the labs and the scan.  Reassuring PSA.  White blood count hemoglobin good.

## 2021-09-08 NOTE — Telephone Encounter (Signed)
May have Celexa recommend starting at 20 mg 1 daily, #30, 1 refill Patient should be aware that the medication can cause some nausea initially but that typically goes away within a few days  As for the lab work it looks reassuring his PSA is normal.  Still would be a good idea for him to follow through with the second opinion As for the pain radiating into the legs it could be related to the changes we are seeing in the lumbar spine.  I would recommend the patient do a follow-up visit within 3 weeks so we can further discuss this regarding the approaches to this.  That will also allow [place] to follow-up on how the Celexa is doing

## 2021-09-08 NOTE — Telephone Encounter (Signed)
Patient was in hospital 10/11 and would like results of labs and any other test they did in hospital. He is wanting your opinion on them.

## 2021-09-09 MED ORDER — CITALOPRAM HYDROBROMIDE 20 MG PO TABS: ORAL_TABLET | ORAL | 1 refills | Status: DC

## 2021-09-09 NOTE — Telephone Encounter (Signed)
Called patient , no answer or voicemail available.

## 2021-09-09 NOTE — Telephone Encounter (Signed)
Spoke with patient and informed him of drs notes. He verbalizes understanding.

## 2021-09-09 NOTE — Telephone Encounter (Signed)
Celexa sent to pharmacy. Pt does not have voicemail set up at this time. Will need to schedule patient for 3 weeks for follow up

## 2021-09-12 ENCOUNTER — Telehealth: Payer: Self-pay | Admitting: Radiology

## 2021-09-12 ENCOUNTER — Telehealth: Payer: Self-pay | Admitting: Family Medicine

## 2021-09-12 NOTE — Telephone Encounter (Signed)
With me-may have 11 AM visit for Tuesday or 3:10 PM visit for Wednesday.  Outside of that next available would be later next week

## 2021-09-12 NOTE — Telephone Encounter (Signed)
Pt contacted and verbalized understanding.  

## 2021-09-12 NOTE — Telephone Encounter (Signed)
Patient called and left VM, asking for an appt.  I called him back, discussed.  He has had back surgery in 1987, in [place], by a physician no longer practicing.  He has had a recent CT scan of his Lumbar Spine.  He knows he has a bulging disc at L5-S1.  He has many other questions about what the scan showed, and how this pain could have started, what it Pier Laux be related to/from.  I do not have the answers for him, and advised him that as I am not a provider nor has he been examined here, I cannot answer any of his questions.  I offered to make an appt at the [facility] office for him, with an ortho spine specialist, as he has already had advanced imaging done with an diagnosis of a problem.  He declines at this time and will reach back out to [place] if appt needed.

## 2021-09-12 NOTE — Telephone Encounter (Signed)
Patient was seen last week. He states he is still having issues with his bottom area and prostate. He would like to know if he should still go back to [facility] for more testing.   CB# [phone]

## 2021-09-12 NOTE — Telephone Encounter (Signed)
Pt still having weakness in legs and buttock area. Pt called prostate dr today to see if he could be seen but they have not called him back yet.  Pt is wondering what does provider think the pain is coming from. Pt is worried that the prostate is cause of back pain. Pt states he is looking for relief. Pt has urology appt for 11/17/21. Pt states his anxiety and nerves can not handle that. Please advise. Thank you

## 2021-09-13 ENCOUNTER — Other Ambulatory Visit: Payer: Self-pay

## 2021-09-13 ENCOUNTER — Ambulatory Visit (INDEPENDENT_AMBULATORY_CARE_PROVIDER_SITE_OTHER): Payer: BC Managed Care – PPO | Admitting: Family Medicine

## 2021-09-13 ENCOUNTER — Encounter: Payer: Self-pay | Admitting: Family Medicine

## 2021-09-13 VITALS — BP 144/82 | HR 65 | Temp 97.7°F | Ht 70.0 in | Wt 194.0 lb

## 2021-09-13 DIAGNOSIS — M545 Low back pain, unspecified: Secondary | ICD-10-CM | POA: Diagnosis not present

## 2021-09-13 DIAGNOSIS — F411 Generalized anxiety disorder: Secondary | ICD-10-CM

## 2021-09-13 DIAGNOSIS — M5416 Radiculopathy, lumbar region: Secondary | ICD-10-CM | POA: Diagnosis not present

## 2021-09-13 MED ORDER — LORAZEPAM 0.5 MG PO TABS: ORAL_TABLET | ORAL | 0 refills | Status: DC

## 2021-09-13 NOTE — Progress Notes (Signed)
   Subjective:    Patient ID: Jack Gilbert, male    DOB: 1956-11-26, 65 y.o.   MRN: [id number]  HPI Lower back pain x 1 month - recent ER visit 09/06/21 weakness in inner thighs and pelvic area  Patient went to ER.  Had extensive work-up.  Scans did not show any signs of metastatic disease PSA was normal Patient very concerning as prostate cancer and possible metastatic disease needed We did discuss the findings of his CAT scan both of his abdomen pelvis as well as the lumbar spine region and discussed the lab work    Review of Systems     Objective:   Physical Exam Lungs clear heart regular abdomen is soft low back nontender negative straight leg raise Some mild weakness noted in the thighs of both legs patient with subjective pain into this region bilateral.     Assessment & Plan:  Lumbar pain Sciatica symptoms Leg weakness History of previous surgery Symptoms been going on for several months It would be beneficial for the patient to get a MRI of the lumbar spine Referral to orthopedic specialist I find no evidence of prostate cancer he is seeing specialist at [facility] for second opinion regarding this Patient was given reassurance Encouraged him to continue the Celexa In addition to this recommend lorazepam at nighttime temporarily to help with sleep because of his anxiety Follow-up again in 3 to 4 weeks

## 2021-09-21 ENCOUNTER — Ambulatory Visit: Payer: BC Managed Care – PPO | Admitting: Family Medicine

## 2021-09-26 NOTE — Addendum Note (Signed)
Addended by: Vicente Males on: 09/26/2021 08:52 AM   Modules accepted: Orders

## 2021-10-04 ENCOUNTER — Other Ambulatory Visit: Payer: Self-pay

## 2021-10-04 ENCOUNTER — Encounter: Payer: Self-pay | Admitting: Family Medicine

## 2021-10-04 ENCOUNTER — Ambulatory Visit: Payer: BC Managed Care – PPO | Admitting: Family Medicine

## 2021-10-04 VITALS — BP 138/76 | Temp 97.4°F | Wt 188.8 lb

## 2021-10-04 DIAGNOSIS — F411 Generalized anxiety disorder: Secondary | ICD-10-CM | POA: Diagnosis not present

## 2021-10-04 DIAGNOSIS — R0989 Other specified symptoms and signs involving the circulatory and respiratory systems: Secondary | ICD-10-CM | POA: Diagnosis not present

## 2021-10-04 DIAGNOSIS — I10 Essential (primary) hypertension: Secondary | ICD-10-CM

## 2021-10-04 DIAGNOSIS — R0982 Postnasal drip: Secondary | ICD-10-CM

## 2021-10-04 MED ORDER — CITALOPRAM HYDROBROMIDE 40 MG PO TABS: ORAL_TABLET | ORAL | 3 refills | Status: DC

## 2021-10-04 MED ORDER — AZELASTINE HCL 0.1 % NA SOLN
2.0000 | Freq: Two times a day (BID) | NASAL | 12 refills | Status: DC
Start: 2021-10-04 — End: 2023-01-11

## 2021-10-04 MED ORDER — PANTOPRAZOLE SODIUM 40 MG PO TBEC: 40.0000 mg | DELAYED_RELEASE_TABLET | Freq: Every day | ORAL | 3 refills | Status: DC

## 2021-10-04 NOTE — Progress Notes (Signed)
   Subjective:    Patient ID: Jack Gilbert, male    DOB: 1956/02/04, 65 y.o.   MRN: [id number]  HPI Pt here for follow up on back pain. Pt states he stills is having some pain that goes into legs.  Having ongoing burning pain and discomfort into the buttocks as well as the upper legs he will be seen radiology later this week for his MRI and then he will see the back specialist next week  Patient will be seeing specialist at [facility] for evaluation of prostate.  Patient is concerned he has prostate cancer.  Patient is concerned that he may need advanced MRI or other measures.  Rather than [place] doing additional tests I did recommend the patient follow through with seeing the specialist at [place] has also had to clear his throat a lot more than usual. Pt also has had some runny nose. Has tried Claritin and Flonase.  Patient relates some intermittent postnasal drainage she denies wheezing or difficulty breathing.  Patient does relate intermittent clearing of his throat and wonders if he needs to be on an antibiotic  Patient has underlying anxiety issues and concerns himself with finding the possibility of having more serious disease.  He states that this is been troublesome to him. Review of Systems     Objective:   Physical Exam  Eardrums are normal throat minimal redness neck no adenopathy several episodes of clearing of the throat noted no crackles in the lungs no phlegm noted no sinus tenderness.  Lungs are clear no crackles heart regular      Assessment & Plan:  Viral syndrome versus respiratory illness versus environmental-humidifier as needed, Astelin nasal spray, Claritin ongoing, give [place] feedback over the course of the next few weeks if not doing better sooner if problems sinus pain or fever  Lower back pain into the leg pain MRI will be helpful await the results of this also sees spine specialist next week  Prostate issue it would be beneficial for the patient to see the specialist  at [facility] for further evaluation hopefully does not have prostate cancer  Also clearing and a throat I believe is more habitual at this point could be due to environmental reasons or postnasal drip Astelin Claritin no antibiotics are indicated currently.  If high fevers progressive troubles or worse follow-up  Anxiety/GAD-bump up dose of citalopram 40 mg daily if progressive troubles or worse to notify us  Follow-up in 3 months but can be 6 to 8 weeks sooner if problems

## 2021-10-06 ENCOUNTER — Other Ambulatory Visit: Payer: Self-pay

## 2021-10-06 ENCOUNTER — Ambulatory Visit (HOSPITAL_COMMUNITY)
Admission: RE | Admit: 2021-10-06 | Discharge: 2021-10-06 | Disposition: A | Discharge status: Home / Self Care | Payer: BC Managed Care – PPO | Source: Ambulatory Visit | Attending: Family Medicine | Admitting: Family Medicine

## 2021-10-06 DIAGNOSIS — M5416 Radiculopathy, lumbar region: Secondary | ICD-10-CM | POA: Diagnosis not present

## 2021-10-06 DIAGNOSIS — M545 Low back pain, unspecified: Secondary | ICD-10-CM | POA: Diagnosis not present

## 2021-10-06 MED ORDER — GADOBUTROL 1 MMOL/ML IV SOLN
10.0000 mL | Freq: Once | INTRAVENOUS | Status: AC | PRN
  Administered 2021-10-06: 10 mL via INTRAVENOUS

## 2021-10-10 DIAGNOSIS — M5451 Vertebrogenic low back pain: Secondary | ICD-10-CM | POA: Diagnosis not present

## 2021-11-16 DIAGNOSIS — R972 Elevated prostate specific antigen [PSA]: Secondary | ICD-10-CM | POA: Diagnosis not present

## 2021-11-16 DIAGNOSIS — F17290 Nicotine dependence, other tobacco product, uncomplicated: Secondary | ICD-10-CM | POA: Diagnosis not present

## 2021-11-16 DIAGNOSIS — Z125 Encounter for screening for malignant neoplasm of prostate: Secondary | ICD-10-CM | POA: Diagnosis not present

## 2021-11-16 DIAGNOSIS — R1030 Lower abdominal pain, unspecified: Secondary | ICD-10-CM | POA: Diagnosis not present

## 2021-11-25 DIAGNOSIS — M9905 Segmental and somatic dysfunction of pelvic region: Secondary | ICD-10-CM | POA: Diagnosis not present

## 2021-11-25 DIAGNOSIS — M4124 Other idiopathic scoliosis, thoracic region: Secondary | ICD-10-CM | POA: Diagnosis not present

## 2021-11-25 DIAGNOSIS — M9903 Segmental and somatic dysfunction of lumbar region: Secondary | ICD-10-CM | POA: Diagnosis not present

## 2021-11-25 DIAGNOSIS — M9902 Segmental and somatic dysfunction of thoracic region: Secondary | ICD-10-CM | POA: Diagnosis not present

## 2021-12-01 ENCOUNTER — Ambulatory Visit: Payer: BC Managed Care – PPO | Admitting: Family Medicine

## 2021-12-16 ENCOUNTER — Telehealth: Payer: Self-pay | Admitting: Family Medicine

## 2021-12-16 NOTE — Telephone Encounter (Signed)
Pt would like a referral from Dr Nicki Reaper to any dermatologist in [place].   [phone]

## 2021-12-18 NOTE — Telephone Encounter (Signed)
Nurses-I am fine with a dermatology referral.  I would recommend referral to a residency trained board-certified dermatologist-the only ones in [facility] or Dr. Nevada Crane, and Dr. Rozann Lesches group here in [place]. (Dr. Tarri Glenn in [place] is not-if patient insistent at going there may have referral otherwise I would recommend Dr. Hall/Dr. Rozann Lesches or [facility] in [place], Dr. Denna Haggard in [place])

## 2021-12-19 NOTE — Telephone Encounter (Signed)
Telephone call-voicemail not set up ?

## 2021-12-21 ENCOUNTER — Telehealth: Payer: Self-pay | Admitting: Family Medicine

## 2021-12-21 NOTE — Telephone Encounter (Signed)
See 12/21/21 -Message- patient would like to see Rheumatology not Dermatology.

## 2021-12-21 NOTE — Telephone Encounter (Signed)
The only rheumatologists are in [place] can often take weeks to get in with Other rheumatology would be [facility] or [facility] but these are probably no sooner If the patient would like to discuss this further we can see him for an office visit He has an office visit in February we can discuss his arthritic discomforts at that time It least we could do preliminary evaluation and testing I would recommend go ahead with referral but it could take weeks to get in with specialist Once again [place] is the closest

## 2021-12-21 NOTE — Telephone Encounter (Signed)
Spoke with patient regarding referral request, patient is requesting a referral to the rheumatologist nearby if available for low back pain , leg, and shoulder pain. He was originally referred to [facility] rheumatology by his chiropractor. They do not have anything until April. Please advise.  Please disregard dermatology request per patient.

## 2021-12-21 NOTE — Telephone Encounter (Signed)
Patient was calling checking on referral for Rheumatology.I didn't see where one has been sent over to [place]. Please advise

## 2021-12-21 NOTE — Telephone Encounter (Signed)
Patient has been informed per provider recommendations, pt states will discuss at next [place] in February.

## 2021-12-23 DIAGNOSIS — M542 Cervicalgia: Secondary | ICD-10-CM | POA: Diagnosis not present

## 2021-12-23 DIAGNOSIS — M25511 Pain in right shoulder: Secondary | ICD-10-CM | POA: Diagnosis not present

## 2021-12-23 DIAGNOSIS — M25512 Pain in left shoulder: Secondary | ICD-10-CM | POA: Diagnosis not present

## 2021-12-29 DIAGNOSIS — M109 Gout, unspecified: Secondary | ICD-10-CM | POA: Diagnosis not present

## 2021-12-29 DIAGNOSIS — M2550 Pain in unspecified joint: Secondary | ICD-10-CM | POA: Diagnosis not present

## 2021-12-29 DIAGNOSIS — M542 Cervicalgia: Secondary | ICD-10-CM | POA: Diagnosis not present

## 2021-12-29 DIAGNOSIS — M25511 Pain in right shoulder: Secondary | ICD-10-CM | POA: Diagnosis not present

## 2022-01-04 ENCOUNTER — Ambulatory Visit (INDEPENDENT_AMBULATORY_CARE_PROVIDER_SITE_OTHER): Payer: BC Managed Care – PPO | Admitting: Family Medicine

## 2022-01-04 ENCOUNTER — Other Ambulatory Visit: Payer: Self-pay

## 2022-01-04 VITALS — BP 136/84 | Ht 70.0 in | Wt 194.4 lb

## 2022-01-04 DIAGNOSIS — M25512 Pain in left shoulder: Secondary | ICD-10-CM

## 2022-01-04 DIAGNOSIS — M5431 Sciatica, right side: Secondary | ICD-10-CM

## 2022-01-04 DIAGNOSIS — R972 Elevated prostate specific antigen [PSA]: Secondary | ICD-10-CM

## 2022-01-04 DIAGNOSIS — M545 Low back pain, unspecified: Secondary | ICD-10-CM

## 2022-01-04 MED ORDER — DICLOFENAC SODIUM 75 MG PO TBEC: DELAYED_RELEASE_TABLET | ORAL | 0 refills | Status: DC

## 2022-01-04 NOTE — Progress Notes (Signed)
° °  Subjective:    Patient ID: Jack Gilbert, male    DOB: 1956/09/03, 66 y.o.   MRN: [id number]  Hypertension This is a chronic problem. The current episode started more than 1 year ago. Risk factors for coronary artery disease include male gender.   Still having problems with joint pain- saw ortho and the did injections in shoulders but only lasted a few days Patient scheduled in April to see rheumatology Review of Systems     Objective:   Physical Exam  Lungs clear heart regular discomfort left shoulder right shoulder fair range of motion Low back subjective discomfort worse on the right than the left with sciatica on the right with straight leg raise abdomen soft flanks nontender      Assessment & Plan:  1. Sciatica of right side Exercises shown follow-up if ongoing troubles  2. Lumbar pain Stretches shown avoid excessive or heavy lifting  3. Acute pain of left shoulder Gentle range of motion as shown printout given follow-up with orthopedics if ongoing  4. Elevated PSA Follow-up with urologist every 6 months last lab work looked reassuring and the low likelihood of cancer patient will let us know if he is having further troubles otherwise follow-up within 6 months wellness later this year

## 2022-01-06 DIAGNOSIS — M25551 Pain in right hip: Secondary | ICD-10-CM | POA: Diagnosis not present

## 2022-01-06 DIAGNOSIS — M25552 Pain in left hip: Secondary | ICD-10-CM | POA: Diagnosis not present

## 2022-01-06 DIAGNOSIS — M25511 Pain in right shoulder: Secondary | ICD-10-CM | POA: Diagnosis not present

## 2022-01-09 ENCOUNTER — Other Ambulatory Visit: Payer: Self-pay

## 2022-01-09 DIAGNOSIS — I10 Essential (primary) hypertension: Secondary | ICD-10-CM

## 2022-01-09 MED ORDER — METOPROLOL SUCCINATE ER 100 MG PO TB24: ORAL_TABLET | ORAL | 5 refills | Status: DC

## 2022-01-22 ENCOUNTER — Telehealth: Payer: Self-pay | Admitting: Family Medicine

## 2022-01-22 NOTE — Telephone Encounter (Signed)
Nurses- We were alerted by the pharmacy that medications that the patient is currently on.  Citalopram 40 mg.  And pantoprazole could potentially interact to increase the risk of heart arrhythmias.  In the situations it is recommended to reduce that risk or another words avoid that risk #1 either reduce citalopram from 40 mg down to 20 mg-which at that dosage would get along with the acid blocker  Choice #2-switch from citalopram to sertraline which is Zoloft which can be effective but not have that drug interaction  #3 if patient is not taking the pantoprazole then we can remove that from his list and he can continue citalopram at 40 mg daily  Please see what the patient would like to do thank you

## 2022-01-23 NOTE — Telephone Encounter (Signed)
Telephone call-voicemail not set up ?

## 2022-01-24 ENCOUNTER — Other Ambulatory Visit: Payer: Self-pay | Admitting: Family Medicine

## 2022-01-24 ENCOUNTER — Other Ambulatory Visit: Payer: Self-pay

## 2022-01-24 DIAGNOSIS — F411 Generalized anxiety disorder: Secondary | ICD-10-CM

## 2022-01-24 MED ORDER — CITALOPRAM HYDROBROMIDE 20 MG PO TABS: ORAL_TABLET | ORAL | 1 refills | Status: DC

## 2022-01-24 NOTE — Telephone Encounter (Signed)
RX was sent for 20 mg to [facility] [place]

## 2022-01-24 NOTE — Telephone Encounter (Signed)
Telephone call-voicemail not set up ?

## 2022-01-24 NOTE — Telephone Encounter (Signed)
We were alerted by the pharmacy that medications that the patient is currently on.  Citalopram 40 mg.  And pantoprazole could potentially interact to increase the risk of heart arrhythmias.   In the situations it is recommended to reduce that risk or another words avoid that risk #1 either reduce citalopram from 40 mg down to 20 mg-which at that dosage would get along with the acid blocker   Choice #2-switch from citalopram to sertraline which is Zoloft which can be effective but not have that drug interaction   #3 if patient is not taking the pantoprazole then we can remove that from his list and he can continue citalopram at 40 mg daily   Please see what the patient would like to do thank you   ----patient states is not currently on pantoprazole , and is taking citalopram 20 mg  He would like a prescription for [facility] [place]

## 2022-01-24 NOTE — Telephone Encounter (Signed)
Attempted to contact patient; pt does not have voicemail set up at this time

## 2022-01-24 NOTE — Telephone Encounter (Signed)
Patient has been informed per drs orders.  ?

## 2022-01-30 DIAGNOSIS — H01001 Unspecified blepharitis right upper eyelid: Secondary | ICD-10-CM | POA: Diagnosis not present

## 2022-01-30 DIAGNOSIS — H01004 Unspecified blepharitis left upper eyelid: Secondary | ICD-10-CM | POA: Diagnosis not present

## 2022-01-30 DIAGNOSIS — H25812 Combined forms of age-related cataract, left eye: Secondary | ICD-10-CM | POA: Diagnosis not present

## 2022-01-30 DIAGNOSIS — H01002 Unspecified blepharitis right lower eyelid: Secondary | ICD-10-CM | POA: Diagnosis not present

## 2022-02-24 DIAGNOSIS — M545 Low back pain, unspecified: Secondary | ICD-10-CM | POA: Diagnosis not present

## 2022-02-24 DIAGNOSIS — M064 Inflammatory polyarthropathy: Secondary | ICD-10-CM | POA: Diagnosis not present

## 2022-02-24 DIAGNOSIS — M1991 Primary osteoarthritis, unspecified site: Secondary | ICD-10-CM | POA: Diagnosis not present

## 2022-02-24 DIAGNOSIS — R5383 Other fatigue: Secondary | ICD-10-CM | POA: Diagnosis not present

## 2022-03-09 ENCOUNTER — Encounter (HOSPITAL_COMMUNITY)
Admission: RE | Admit: 2022-03-09 | Discharge: 2022-03-09 | Disposition: A | Discharge status: Home / Self Care | Payer: BC Managed Care – PPO | Source: Ambulatory Visit | Attending: Ophthalmology | Admitting: Ophthalmology

## 2022-03-09 DIAGNOSIS — H25812 Combined forms of age-related cataract, left eye: Secondary | ICD-10-CM | POA: Diagnosis not present

## 2022-03-09 NOTE — Pre-Procedure Instructions (Signed)
Attempted pre-op phone call. No voicemail to leave a message. ?

## 2022-03-13 ENCOUNTER — Other Ambulatory Visit: Payer: Self-pay

## 2022-03-13 ENCOUNTER — Encounter (HOSPITAL_COMMUNITY): Payer: Self-pay

## 2022-03-13 NOTE — Pre-Procedure Instructions (Signed)
Attempted pre-op phone call again. Mo voicemail set up. I called his wife, Eustaquio Maize @ [phone] and left her a message to please call us back for pre-op instructions. ?

## 2022-03-15 NOTE — H&P (Signed)
Surgical History & Physical ? ?Patient Name: Jack Gilbert DOB: Jun 24, 1956 ? ?Surgery: Cataract extraction with intraocular lens implant phacoemulsification; Left Eye ? ?Surgeon: Baruch Goldmann MD ?Surgery Date:  03-17-22 ?Pre-Op Date:  03-09-22 ? ?HPI: ?A 45 Yr. old male patient is here for cataract eval OS . S/P cataract sx OD 06/2019. 1. 1. The patient complains of difficulty when Hazy vision, which began 8 months ago. The left eye is affected. The episode is gradual. The condition's severity increased since last visit. Symptoms occur when the patient is inside, outside and reading. This is negatively affecting the patient's quality of life and the patient is unable to function adequately in life with the current level of vision. HPI Completed by Dr. Baruch Goldmann ? ?Medical History: ?Cataracts ?Heart Problem ? ?Review of Systems ?Negative Allergic/Immunologic ?Negative Cardiovascular ?Negative Constitutional ?Negative Ear, Nose, Mouth & Throat ?Negative Endocrine ?Negative Eyes ?Negative Gastrointestinal ?Negative Genitourinary ?Negative Hemotologic/Lymphatic ?Negative Integumentary ?Negative Musculoskeletal ?Negative Neurological ?Negative Psychiatry ?Negative Respiratory ? ?Social ?  Former smoker  ? ?Medication ?Prednisolon-gatiflox-bromfenac,  ?Metoprolol, Depression med,  ? ?Sx/Procedures ?Phaco c IOL OD,  ?Gallbladder Removal, Back Surgery,  ? ?Drug Allergies  ? NKDA ? ?History & Physical: ?Heent: Cataract, Left Eye ?NECK: supple without bruits ?LUNGS: lungs clear to auscultation ?CV: regular rate and rhythm ?Abdomen: soft and non-tender ? ?Impression & Plan: ?Assessment: ?1.  COMBINED FORMS AGE RELATED CATARACT; Left Eye [phone]) ?2.  BLEPHARITIS; Right Upper Lid, Right Lower Lid, Left Upper Lid, Left Lower Lid (H01.001, H01.002,H01.004,H01.005) ?3.  Pinguecula; Both Eyes (H11.153) ? ?Plan: 1.  Cataract accounts for the patient's decreased vision. This visual impairment is not correctable with a tolerable  change in glasses or contact lenses. Cataract surgery with an implantation of a new lens should significantly improve the visual and functional status of the patient. Discussed all risks, benefits, alternatives, and potential complications. Discussed the procedures and recovery. Patient desires to have surgery. A-scan ordered and performed today for intra-ocular lens calculations. The surgery will be performed in order to improve vision for driving, reading, and for eye examinations. Recommend phacoemulsification with intra-ocular lens. Recommend Dextenza for post-operative pain and inflammation. ?Left Eye. ?Surgery required to correct imbalance of vision. ?Dilates well - shugarcaine by protocol. ?DCB00 to match eyes. ? ?2.  Recommend regular lid cleaning. ? ?3.  Observe; Artificial tears as needed for irritation. ?

## 2022-03-17 ENCOUNTER — Encounter (HOSPITAL_COMMUNITY)
Admission: RE | Disposition: A | Discharge status: Home / Self Care | Payer: Self-pay | Source: Home / Self Care | Attending: Ophthalmology

## 2022-03-17 ENCOUNTER — Encounter (HOSPITAL_COMMUNITY): Payer: Self-pay | Admitting: Ophthalmology

## 2022-03-17 ENCOUNTER — Ambulatory Visit (HOSPITAL_COMMUNITY)
Admission: RE | Admit: 2022-03-17 | Discharge: 2022-03-17 | Disposition: A | Discharge status: Home / Self Care | Payer: BC Managed Care – PPO | Attending: Ophthalmology | Admitting: Ophthalmology

## 2022-03-17 ENCOUNTER — Ambulatory Visit (HOSPITAL_COMMUNITY): Payer: BC Managed Care – PPO | Admitting: Anesthesiology

## 2022-03-17 DIAGNOSIS — H0100A Unspecified blepharitis right eye, upper and lower eyelids: Secondary | ICD-10-CM | POA: Diagnosis not present

## 2022-03-17 DIAGNOSIS — H0100B Unspecified blepharitis left eye, upper and lower eyelids: Secondary | ICD-10-CM | POA: Insufficient documentation

## 2022-03-17 DIAGNOSIS — H2181 Floppy iris syndrome: Secondary | ICD-10-CM | POA: Diagnosis not present

## 2022-03-17 DIAGNOSIS — Z87891 Personal history of nicotine dependence: Secondary | ICD-10-CM | POA: Insufficient documentation

## 2022-03-17 DIAGNOSIS — I10 Essential (primary) hypertension: Secondary | ICD-10-CM | POA: Diagnosis not present

## 2022-03-17 DIAGNOSIS — H25812 Combined forms of age-related cataract, left eye: Secondary | ICD-10-CM | POA: Diagnosis not present

## 2022-03-17 HISTORY — PX: CATARACT EXTRACTION W/PHACO: SHX586

## 2022-03-17 SURGERY — PHACOEMULSIFICATION, CATARACT, WITH IOL INSERTION
Anesthesia: Monitor Anesthesia Care | Site: Eye | Laterality: Left

## 2022-03-17 MED ORDER — TROPICAMIDE 1 % OP SOLN
1.0000 [drp] | OPHTHALMIC | Status: AC | PRN
  Administered 2022-03-17 (×3): 1 [drp] via OPHTHALMIC

## 2022-03-17 MED ORDER — POVIDONE-IODINE 5 % OP SOLN
OPHTHALMIC | Status: DC | PRN
Start: 2022-03-17 — End: 2022-03-17
  Administered 2022-03-17: 1 via OPHTHALMIC

## 2022-03-17 MED ORDER — NEOMYCIN-POLYMYXIN-DEXAMETH 3.5-10000-0.1 OP SUSP
OPHTHALMIC | Status: DC | PRN
  Administered 2022-03-17: 2 [drp] via OPHTHALMIC

## 2022-03-17 MED ORDER — TETRACAINE HCL 0.5 % OP SOLN
1.0000 [drp] | OPHTHALMIC | Status: AC | PRN
  Administered 2022-03-17 (×3): 1 [drp] via OPHTHALMIC

## 2022-03-17 MED ORDER — PHENYLEPHRINE HCL 2.5 % OP SOLN
1.0000 [drp] | OPHTHALMIC | Status: AC | PRN
  Administered 2022-03-17 (×3): 1 [drp] via OPHTHALMIC

## 2022-03-17 MED ORDER — MIDAZOLAM HCL 5 MG/5ML IJ SOLN
INTRAMUSCULAR | Status: DC | PRN
Start: 2022-03-17 — End: 2022-03-17
  Administered 2022-03-17: 2 mg via INTRAVENOUS

## 2022-03-17 MED ORDER — STERILE WATER FOR IRRIGATION IR SOLN
Status: DC | PRN
  Administered 2022-03-17: 250 mL

## 2022-03-17 MED ORDER — LIDOCAINE HCL 3.5 % OP GEL
1.0000 "application " | Freq: Once | OPHTHALMIC | Status: AC
  Administered 2022-03-17: 1 via OPHTHALMIC

## 2022-03-17 MED ORDER — EPINEPHRINE PF 1 MG/ML IJ SOLN
INTRAMUSCULAR | Status: DC | PRN
  Administered 2022-03-17: 500 mL

## 2022-03-17 MED ORDER — BSS IO SOLN
INTRAOCULAR | Status: DC | PRN
  Administered 2022-03-17: 15 mL via INTRAOCULAR

## 2022-03-17 MED ORDER — SODIUM CHLORIDE 0.9% FLUSH
INTRAVENOUS | Status: DC | PRN
  Administered 2022-03-17: 5 mL via INTRAVENOUS

## 2022-03-17 MED ORDER — MIDAZOLAM HCL 2 MG/2ML IJ SOLN
INTRAMUSCULAR | Status: AC
  Filled 2022-03-17: qty 2

## 2022-03-17 MED ORDER — SIGHTPATH DOSE#1 NA HYALUR & NA CHOND-NA HYALUR IO KIT
PACK | INTRAOCULAR | Status: DC | PRN
  Administered 2022-03-17: 1 via OPHTHALMIC

## 2022-03-17 MED ORDER — EPINEPHRINE PF 1 MG/ML IJ SOLN
INTRAOCULAR | Status: DC | PRN
  Administered 2022-03-17: 1 mL via OPHTHALMIC

## 2022-03-17 SURGICAL SUPPLY — 19 items
CATARACT SUITE SIGHTPATH (MISCELLANEOUS) ×2 IMPLANT
CLOTH BEACON ORANGE TIMEOUT ST (SAFETY) ×3 IMPLANT
EYE SHIELD UNIVERSAL CLEAR (GAUZE/BANDAGES/DRESSINGS) ×1 IMPLANT
FEE CATARACT SUITE SIGHTPATH (MISCELLANEOUS) ×2 IMPLANT
GLOVE BIOGEL PI IND STRL 7.0 (GLOVE) ×4 IMPLANT
GLOVE BIOGEL PI IND STRL 8 (GLOVE) IMPLANT
GLOVE BIOGEL PI INDICATOR 7.0 (GLOVE) ×2
GLOVE BIOGEL PI INDICATOR 8 (GLOVE) ×1
GOWN STRL REUS W/TWL XL LVL3 (GOWN DISPOSABLE) ×1 IMPLANT
LENS IOL DIOP 19.5 (Intraocular Lens) ×2 IMPLANT
LENS IOL TECNIS MONO 19.5 (Intraocular Lens) IMPLANT
NDL HYPO 18GX1.5 BLUNT FILL (NEEDLE) ×2 IMPLANT
NEEDLE HYPO 18GX1.5 BLUNT FILL (NEEDLE) ×2 IMPLANT
PAD ARMBOARD 7.5X6 YLW CONV (MISCELLANEOUS) ×3 IMPLANT
RING MALYGIN 7.0 (MISCELLANEOUS) ×1 IMPLANT
SYR TB 1ML LL NO SAFETY (SYRINGE) ×3 IMPLANT
TAPE SURG TRANSPORE 1 IN (GAUZE/BANDAGES/DRESSINGS) IMPLANT
TAPE SURGICAL TRANSPORE 1 IN (GAUZE/BANDAGES/DRESSINGS) ×2
WATER STERILE IRR 250ML POUR (IV SOLUTION) ×3 IMPLANT

## 2022-03-17 NOTE — Transfer of Care (Signed)
Immediate Anesthesia Transfer of Care Note ? ?Patient: Jack Gilbert ? ?Procedure(s) Performed: CATARACT EXTRACTION PHACO AND INTRAOCULAR LENS PLACEMENT (IOC) (Left: Eye) ? ?Patient Location: Short Stay ? ?Anesthesia Type:MAC ? ?Level of Consciousness: awake, alert , oriented and patient cooperative ? ?Airway & Oxygen Therapy: Patient Spontanous Breathing ? ?Post-op Assessment: Report given to RN, Post -op Vital signs reviewed and stable and Patient moving all extremities ? ?Post vital signs: Reviewed and stable ? ?Last Vitals:  ?Vitals Value Taken Time  ?BP 116/74 03/17/22 1132  ?Temp 36.6 ?C 03/17/22 1132  ?Pulse 59 03/17/22 1132  ?Resp    ?SpO2 98 % 03/17/22 1132  ? ? ?Last Pain:  ?Vitals:  ? 03/17/22 1132  ?TempSrc: Oral  ?PainSc: 0-No pain  ?   ? ?Patients Stated Pain Goal: 7 (03/17/22 9407) ? ?Complications: No notable events documented. ?

## 2022-03-17 NOTE — Interval H&P Note (Signed)
History and Physical Interval Note: ? ?03/17/2022 ?11:02 AM ? ?Jack Gilbert  has presented today for surgery, with the diagnosis of combined forms age related cataract; left.  The various methods of treatment have been discussed with the patient and family. After consideration of risks, benefits and other options for treatment, the patient has consented to  Procedure(s) with comments: ?CATARACT EXTRACTION PHACO AND INTRAOCULAR LENS PLACEMENT (IOC) (Left) - left as a surgical intervention.  The patient's history has been reviewed, patient examined, no change in status, stable for surgery.  I have reviewed the patient's chart and labs.  Questions were answered to the patient's satisfaction.   ? ? ?Baruch Goldmann ? ? ?

## 2022-03-17 NOTE — Anesthesia Preprocedure Evaluation (Signed)
Anesthesia Evaluation  ?Patient identified by MRN, date of birth, ID band ?Patient awake ? ? ? ?Reviewed: ?Allergy & Precautions, H&P , NPO status , Patient's Chart, lab work & pertinent test results, reviewed documented beta blocker date and time  ? ?Airway ?Mallampati: II ? ?TM Distance: >3 FB ?Neck ROM: full ? ? ? Dental ?no notable dental hx. ? ?  ?Pulmonary ?neg pulmonary ROS, former smoker,  ?  ?Pulmonary exam normal ?breath sounds clear to auscultation ? ? ? ? ? ? Cardiovascular ?Exercise Tolerance: Good ?hypertension,  ?Rhythm:regular Rate:Normal ? ? ?  ?Neuro/Psych ?PSYCHIATRIC DISORDERS Anxiety  Neuromuscular disease   ? GI/Hepatic ?Neg liver ROS, hiatal hernia, GERD  Medicated,  ?Endo/Other  ?negative endocrine ROS ? Renal/GU ?negative Renal ROS  ?negative genitourinary ?  ?Musculoskeletal ? ? Abdominal ?  ?Peds ? Hematology ?negative hematology ROS ?(+)   ?Anesthesia Other Findings ? ? Reproductive/Obstetrics ?negative OB ROS ? ?  ? ? ? ? ? ? ? ? ? ? ? ? ? ?  ?  ? ? ? ? ? ? ? ? ?Anesthesia Physical ?Anesthesia Plan ? ?ASA: 2 ? ?Anesthesia Plan: MAC  ? ?Post-op Pain Management:   ? ?Induction:  ? ?PONV Risk Score and Plan:  ? ?Airway Management Planned:  ? ?Additional Equipment:  ? ?Intra-op Plan:  ? ?Post-operative Plan:  ? ?Informed Consent: I have reviewed the patients History and Physical, chart, labs and discussed the procedure including the risks, benefits and alternatives for the proposed anesthesia with the patient or authorized representative who has indicated his/her understanding and acceptance.  ? ? ? ?Dental Advisory Given ? ?Plan Discussed with: CRNA ? ?Anesthesia Plan Comments:   ? ? ? ? ? ? ?Anesthesia Quick Evaluation ? ?

## 2022-03-17 NOTE — Anesthesia Postprocedure Evaluation (Signed)
Anesthesia Post Note ? ?Patient: ALBERTA CAIRNS ? ?Procedure(s) Performed: CATARACT EXTRACTION PHACO AND INTRAOCULAR LENS PLACEMENT (IOC) (Left: Eye) ? ?Patient location during evaluation: Phase II ?Anesthesia Type: MAC ?Level of consciousness: awake ?Pain management: pain level controlled ?Vital Signs Assessment: post-procedure vital signs reviewed and stable ?Respiratory status: spontaneous breathing and respiratory function stable ?Cardiovascular status: blood pressure returned to baseline and stable ?Postop Assessment: no headache and no apparent nausea or vomiting ?Anesthetic complications: no ?Comments: Late entry ? ? ?No notable events documented. ? ? ?Last Vitals:  ?Vitals:  ? 03/17/22 0953 03/17/22 1132  ?BP: 125/77 116/74  ?Pulse:  (!) 59  ?Resp: 15   ?Temp:  36.6 ?C  ?SpO2: 100% 98%  ?  ?Last Pain:  ?Vitals:  ? 03/17/22 1132  ?TempSrc: Oral  ?PainSc: 0-No pain  ? ? ?  ?  ?  ?  ?  ?  ? ?Louann Sjogren ? ? ? ? ?

## 2022-03-17 NOTE — Op Note (Signed)
Date of procedure: 03/17/22 ? ?Pre-operative diagnosis: Visually significant age-related combined-form cataract, Left Eye; Poor dilation, Left eye (H25.812)  ? ?Post-operative diagnosis: Visually significant age-related combined cataract, Left Eye; Intra-operative Floppy Iris Syndrome, Left Eye (H21.81) ? ?Procedure: Complex removal of cataract via phacoemulsification and insertion of intra-ocular lens Wynetta Emery and Johnson DCB00 +19.5D into the capsular bag of the Left Eye (CPT [phone]) ? ?Attending surgeon: Gerda Diss. Marisa Hua, MD, MA ? ?Anesthesia: MAC, Topical Akten ? ?Complications: None ? ?Estimated Blood Loss: <54m (minimal) ? ?Specimens: None ? ?Implants: As above ? ?Indications:  Visually significant cataract, Left Eye ? ?Procedure:  ?The patient was seen and identified in the pre-operative area. The operative eye was identified and dilated.  The operative eye was marked.  Topical anesthesia was administered to the operative eye.    ? ?The patient was then to the operative suite and placed in the supine position.  A timeout was performed confirming the patient, procedure to be performed, and all other relevant information.   The patient's face was prepped and draped in the usual fashion for intra-ocular surgery.  A lid speculum was placed into the operative eye and the surgical microscope moved into place and focused.  Poor dilation of the iris was confirmed.  An inferotemporal paracentesis was created using a 20 gauge paracentesis blade.  Shugarcaine was injected into the anterior chamber.  Viscoelastic was injected into the anterior chamber.  A temporal clear-corneal main wound incision was created using a 2.457mmicrokeratome.  A Malyugin ring was placed.  A continuous curvilinear capsulorrhexis was initiated using an irrigating cystitome and completed using capsulorrhexis forceps.  Hydrodissection and hydrodeliniation were performed.  Viscoelastic was injected into the anterior chamber.  A phacoemulsification  handpiece and a chopper as a second instrument were used to remove the nucleus and epinucleus. The irrigation/aspiration handpiece was used to remove any remaining cortical material.  ? ?The capsular bag was reinflated with viscoelastic, checked, and found to be intact.  The intraocular lens was inserted into the capsular bag and dialed into place using a MaSurveyor, mineralsThe Malyugin ring was removed.  The irrigation/aspiration handpiece was used to remove any remaining viscoelastic.  The clear corneal wound and paracentesis wounds were then hydrated and checked with Weck-Cels to be watertight.  The lid-speculum and drape was removed, and the patient's face was cleaned with a wet and dry 4x4.  Maxitrol was instilled in the eye. A clear shield was taped over the eye. The patient was taken to the post-operative care unit in good condition, having tolerated the procedure well. ? ?Post-Op Instructions: [facility] and will receive all other orders and instructions. ? ?

## 2022-03-17 NOTE — Discharge Instructions (Signed)
Please discharge patient when stable, will follow up today with Dr. Jiles Goya at the [facility] [place] office immediately following discharge.  Leave shield in place until visit.  All paperwork with discharge instructions will be given at the office.  [facility] Address:  [address]  , [place] [address]  

## 2022-03-20 ENCOUNTER — Encounter (HOSPITAL_COMMUNITY): Payer: Self-pay | Admitting: Ophthalmology

## 2022-03-24 DIAGNOSIS — M064 Inflammatory polyarthropathy: Secondary | ICD-10-CM | POA: Diagnosis not present

## 2022-03-24 DIAGNOSIS — M1991 Primary osteoarthritis, unspecified site: Secondary | ICD-10-CM | POA: Diagnosis not present

## 2022-03-24 DIAGNOSIS — M353 Polymyalgia rheumatica: Secondary | ICD-10-CM | POA: Diagnosis not present

## 2022-03-24 DIAGNOSIS — R768 Other specified abnormal immunological findings in serum: Secondary | ICD-10-CM | POA: Diagnosis not present

## 2022-05-04 ENCOUNTER — Ambulatory Visit: Payer: Self-pay | Admitting: Family Medicine

## 2022-05-18 DIAGNOSIS — R768 Other specified abnormal immunological findings in serum: Secondary | ICD-10-CM | POA: Diagnosis not present

## 2022-05-18 DIAGNOSIS — M353 Polymyalgia rheumatica: Secondary | ICD-10-CM | POA: Diagnosis not present

## 2022-05-18 DIAGNOSIS — M1991 Primary osteoarthritis, unspecified site: Secondary | ICD-10-CM | POA: Diagnosis not present

## 2022-05-18 DIAGNOSIS — Z1589 Genetic susceptibility to other disease: Secondary | ICD-10-CM | POA: Diagnosis not present

## 2022-05-25 DIAGNOSIS — D225 Melanocytic nevi of trunk: Secondary | ICD-10-CM | POA: Diagnosis not present

## 2022-05-25 DIAGNOSIS — L820 Inflamed seborrheic keratosis: Secondary | ICD-10-CM | POA: Diagnosis not present

## 2022-07-10 ENCOUNTER — Other Ambulatory Visit: Payer: Self-pay | Admitting: Family Medicine

## 2022-07-10 DIAGNOSIS — I10 Essential (primary) hypertension: Secondary | ICD-10-CM

## 2022-07-19 ENCOUNTER — Other Ambulatory Visit: Payer: Self-pay | Admitting: Family Medicine

## 2022-07-19 DIAGNOSIS — Z1589 Genetic susceptibility to other disease: Secondary | ICD-10-CM | POA: Diagnosis not present

## 2022-07-19 DIAGNOSIS — R768 Other specified abnormal immunological findings in serum: Secondary | ICD-10-CM | POA: Diagnosis not present

## 2022-07-19 DIAGNOSIS — F411 Generalized anxiety disorder: Secondary | ICD-10-CM

## 2022-07-19 DIAGNOSIS — M353 Polymyalgia rheumatica: Secondary | ICD-10-CM | POA: Diagnosis not present

## 2022-07-19 DIAGNOSIS — M1991 Primary osteoarthritis, unspecified site: Secondary | ICD-10-CM | POA: Diagnosis not present

## 2022-09-04 ENCOUNTER — Other Ambulatory Visit: Payer: Self-pay | Admitting: Family Medicine

## 2022-09-04 DIAGNOSIS — F411 Generalized anxiety disorder: Secondary | ICD-10-CM

## 2022-09-05 NOTE — Telephone Encounter (Signed)
Please contact patient to have him schedule appt. Then may route back to nurses. Thank you

## 2022-09-20 DIAGNOSIS — M353 Polymyalgia rheumatica: Secondary | ICD-10-CM | POA: Diagnosis not present

## 2022-09-20 DIAGNOSIS — M0609 Rheumatoid arthritis without rheumatoid factor, multiple sites: Secondary | ICD-10-CM | POA: Diagnosis not present

## 2022-09-20 DIAGNOSIS — R768 Other specified abnormal immunological findings in serum: Secondary | ICD-10-CM | POA: Diagnosis not present

## 2022-09-20 DIAGNOSIS — M1991 Primary osteoarthritis, unspecified site: Secondary | ICD-10-CM | POA: Diagnosis not present

## 2022-10-04 ENCOUNTER — Other Ambulatory Visit: Payer: Self-pay | Admitting: Family Medicine

## 2022-10-04 DIAGNOSIS — I10 Essential (primary) hypertension: Secondary | ICD-10-CM

## 2022-11-01 ENCOUNTER — Other Ambulatory Visit: Payer: Self-pay | Admitting: Family Medicine

## 2022-11-01 DIAGNOSIS — I10 Essential (primary) hypertension: Secondary | ICD-10-CM

## 2022-11-07 ENCOUNTER — Other Ambulatory Visit: Payer: Self-pay | Admitting: Family Medicine

## 2022-11-07 DIAGNOSIS — I10 Essential (primary) hypertension: Secondary | ICD-10-CM

## 2022-11-08 IMAGING — CT CT RENAL STONE PROTOCOL
2 of 4 series · 17 of 46 positions shown, 19 images · non-contrast
Comparison: None.

CLINICAL DATA: Flank pain

EXAM:
CT ABDOMEN AND PELVIS WITHOUT CONTRAST
TECHNIQUE: Multidetector CT imaging of the abdomen and pelvis was performed
following the standard protocol without IV contrast.

[Series 2: axial st · axial · 0.75mm/px · z∈[+906,+1361]mm · 14 of 107 slices shown, 16 images]
[im 8/107  soft-tissue]
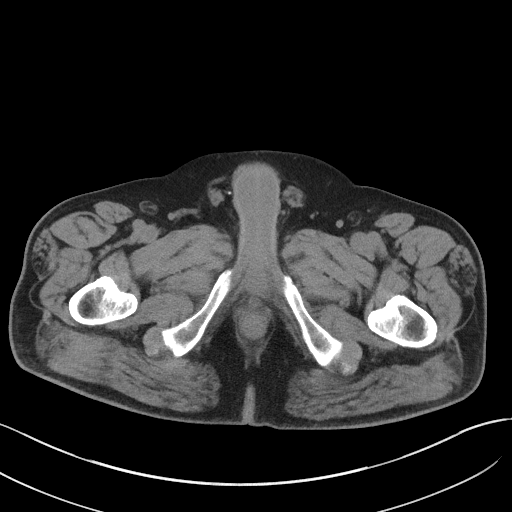
[im 8/107  bone]
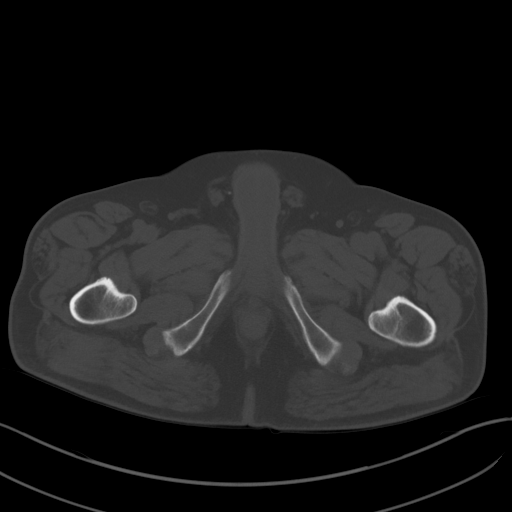
[im 15/107  soft-tissue]
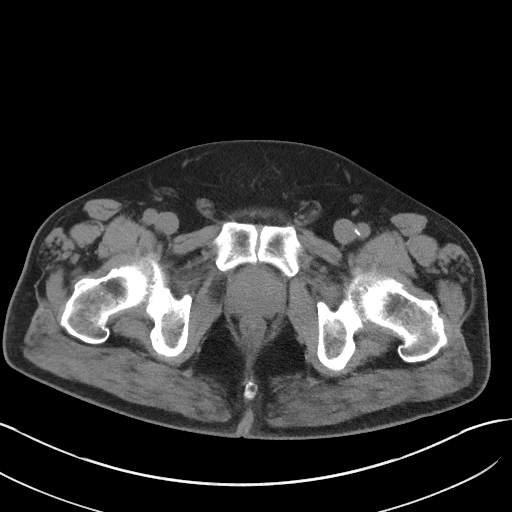
[im 22/107  soft-tissue]
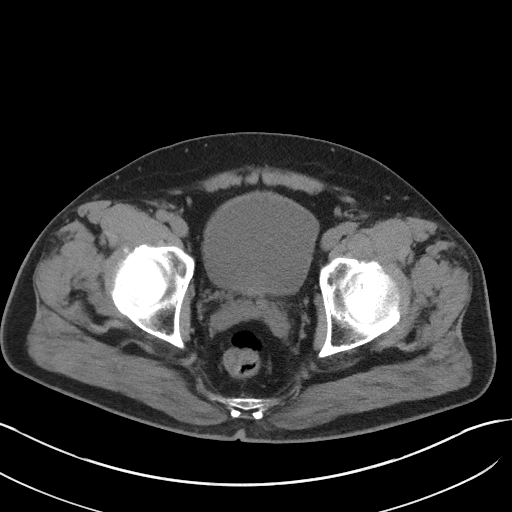
[im 29/107  soft-tissue]
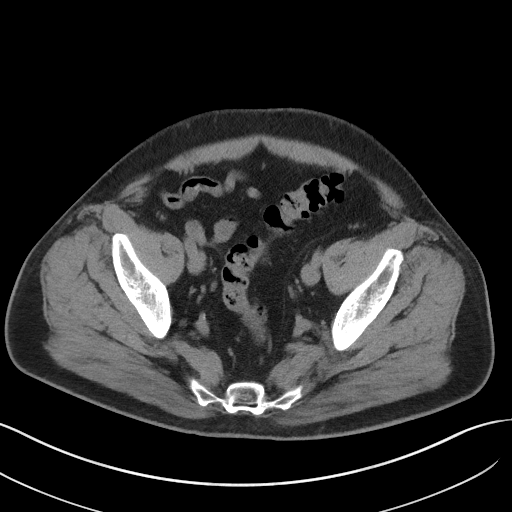
[im 36/107  soft-tissue]
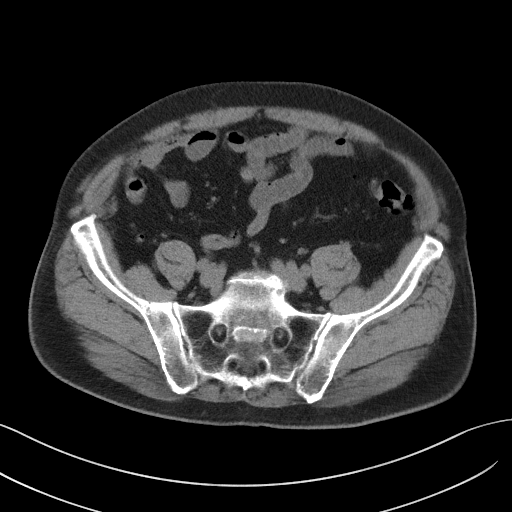
[im 43/107  soft-tissue]
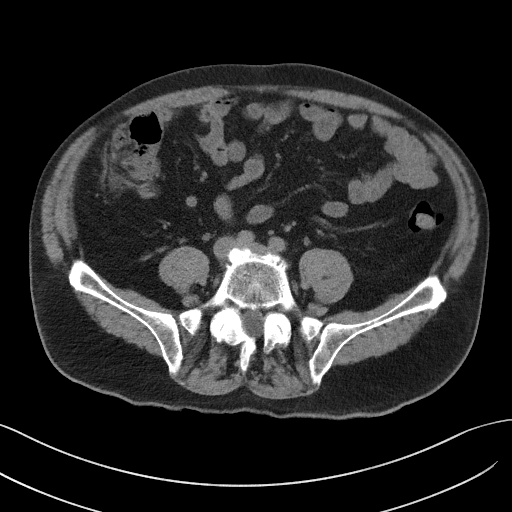
[im 50/107  soft-tissue]
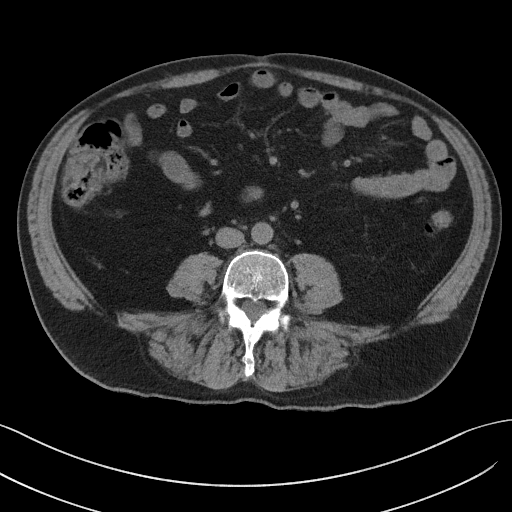
[im 57/107  soft-tissue]
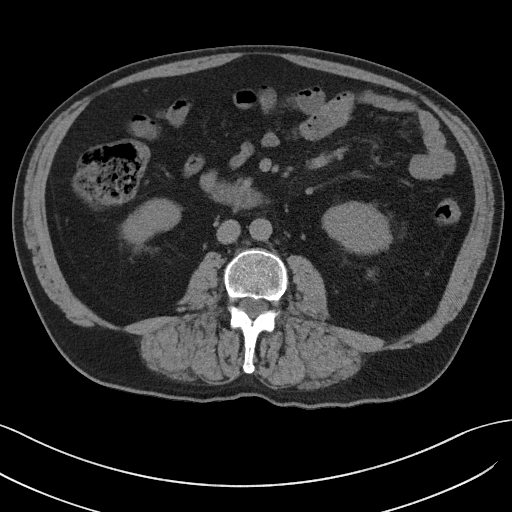
[im 64/107  soft-tissue]
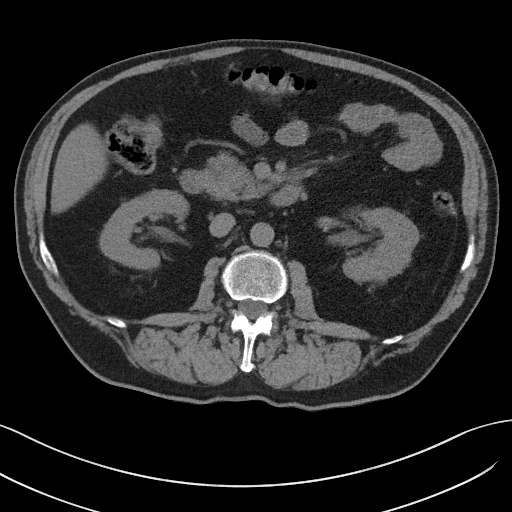
[im 64/107  bone]
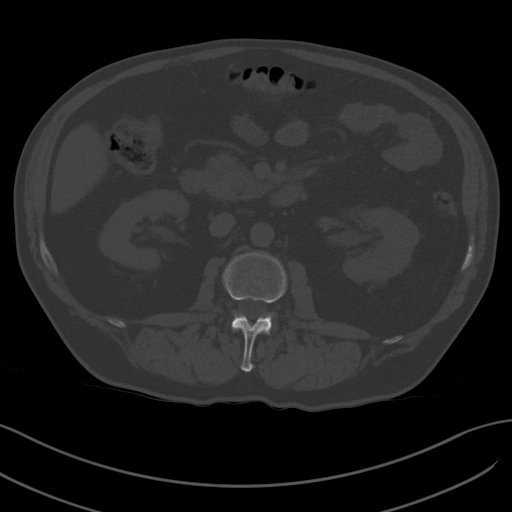
[im 71/107  soft-tissue]
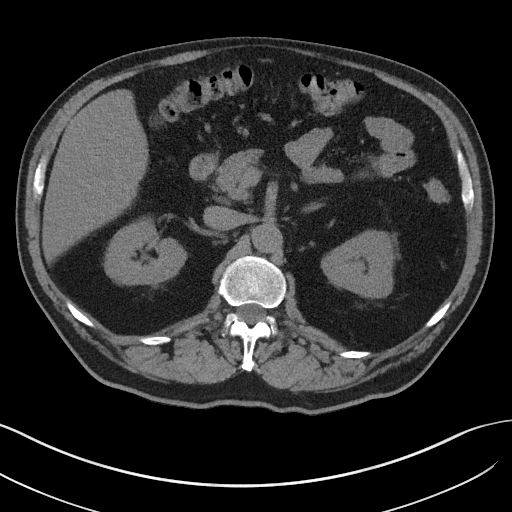
[im 78/107  soft-tissue]
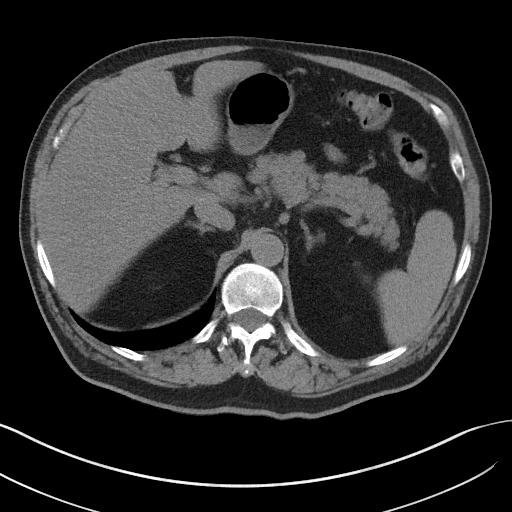
[im 85/107  soft-tissue]
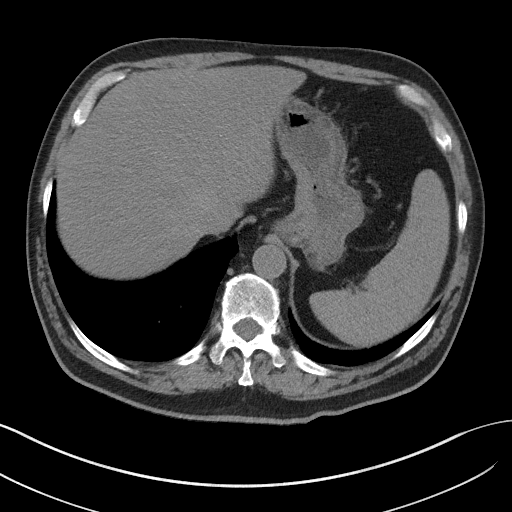
[im 92/107  soft-tissue]
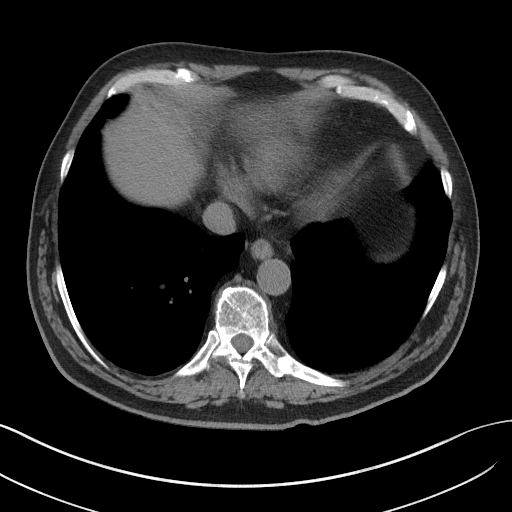
[im 99/107  soft-tissue]
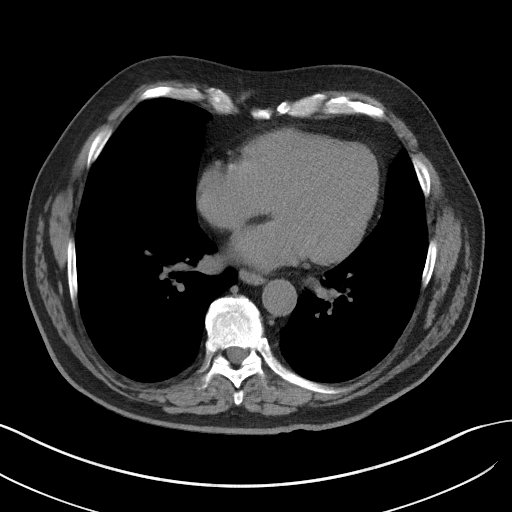

[Series 5: coronal st · coronal · 0.89mm/px · 3 of 101 slices shown]
[im 34/101  soft-tissue]
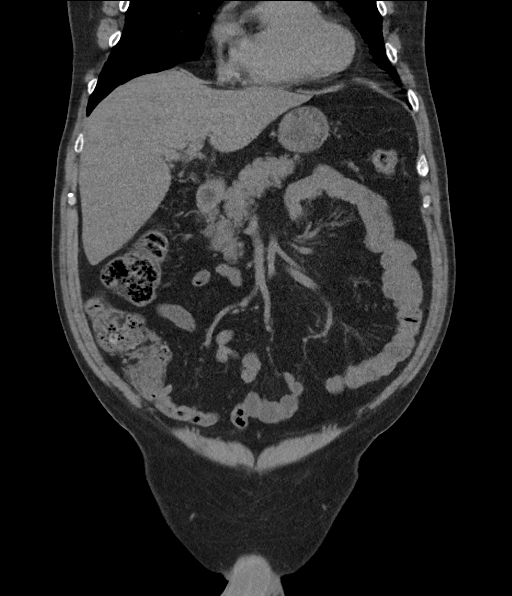
[im 45/101  soft-tissue]
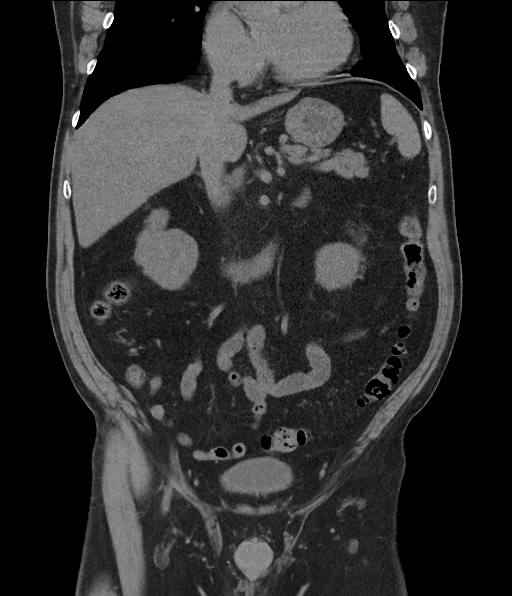
[im 56/101  soft-tissue]
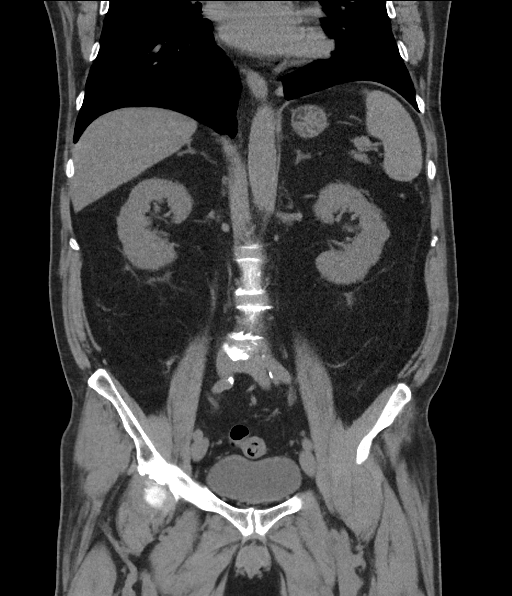

[17 of 46 positions shown; findings below may reference images not displayed]

FINDINGS: Lower chest: Lung bases are clear. No effusions. Heart is normal
size.

Hepatobiliary: No focal liver abnormality is seen. Status post
cholecystectomy. No biliary dilatation.

Pancreas: No focal abnormality or ductal dilatation.

Spleen: No focal abnormality.  Normal size.

Adrenals/Urinary Tract: No adrenal abnormality. No focal renal
abnormality. No stones or hydronephrosis. Urinary bladder is
unremarkable.

Stomach/Bowel: Normal appendix. Stomach, large and small bowel
grossly unremarkable.

Vascular/Lymphatic: Aortic atherosclerosis. No evidence of aneurysm
or adenopathy.

Reproductive: Mildly prominent prostate.

Other: No free fluid or free air.

Musculoskeletal: No acute bony abnormality.
IMPRESSION: No renal or ureteral stones.  No hydronephrosis.

No acute findings in the abdomen or pelvis.

## 2022-11-08 IMAGING — CT CT L SPINE W/O CM
3 series · 13 of 33 positions shown, 16 images · non-contrast
Comparison: CT scan from 2285

CLINICAL DATA: Mid back and low pelvic pain.

EXAM:
CT LUMBAR SPINE WITHOUT CONTRAST
TECHNIQUE: Multidetector CT imaging of the lumbar spine was performed without
intravenous contrast administration. Multiplanar CT image
reconstructions were also generated.

[Series 8: lspine bone · axial · 0.33mm/px · z∈[+1071,+1257]mm · 5 of 135 slices shown, 7 images]
[im 21/135  soft-tissue]
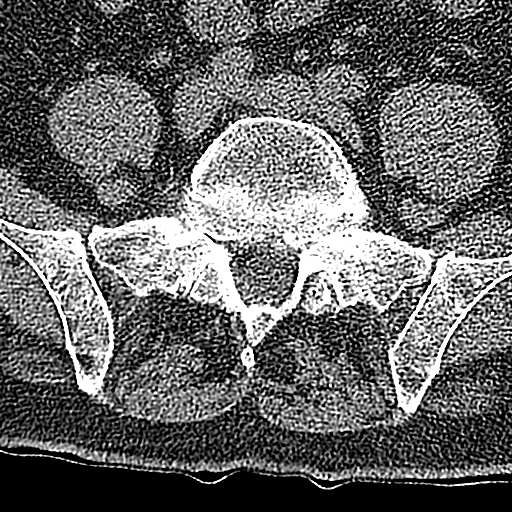
[im 21/135  bone]
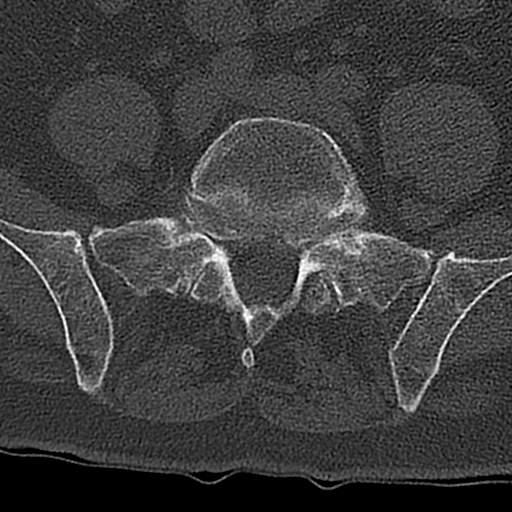
[im 42/135  bone]
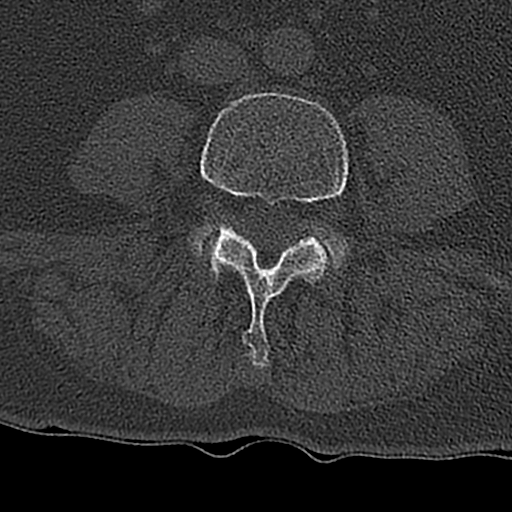
[im 73/135  bone]
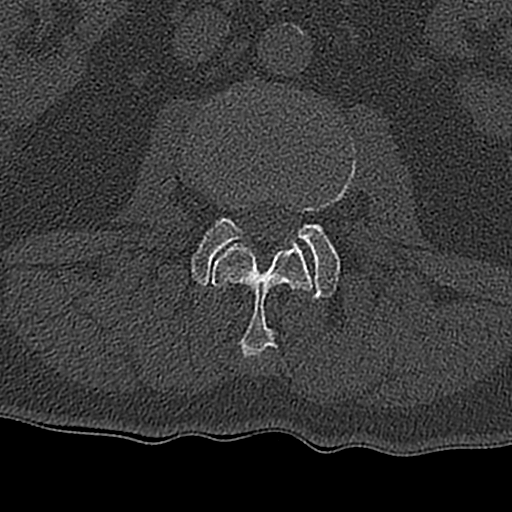
[im 93/135  bone]
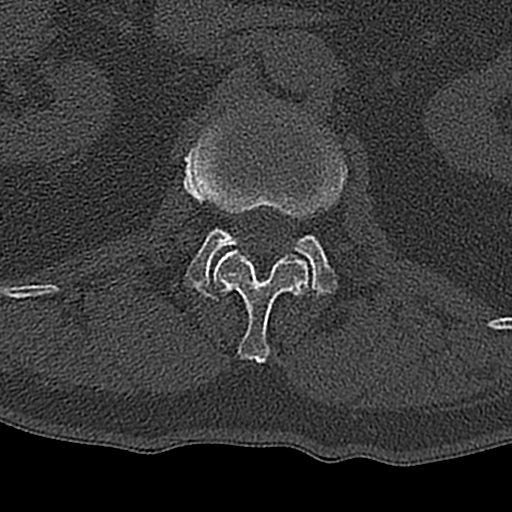
[im 114/135  soft-tissue]
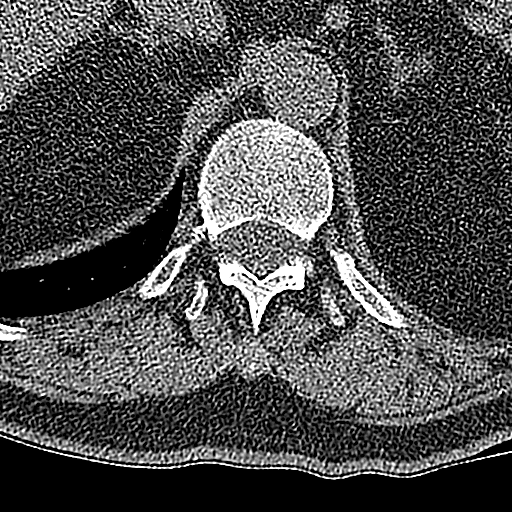
[im 114/135  bone]
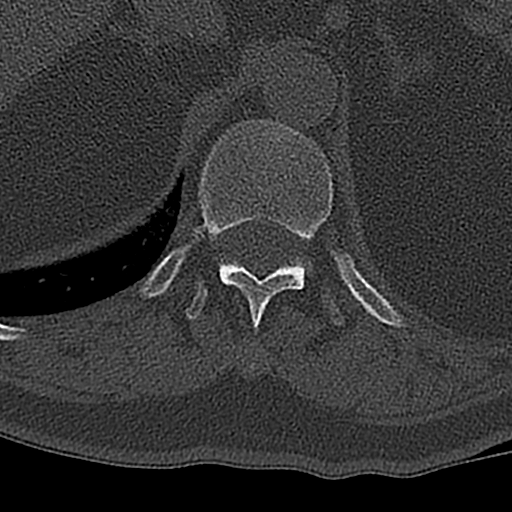

[Series 10: lspine bone sag 2 · sagittal · 0.35mm/px · 5 of 84 slices shown, 6 images]
[im 28/84  bone]
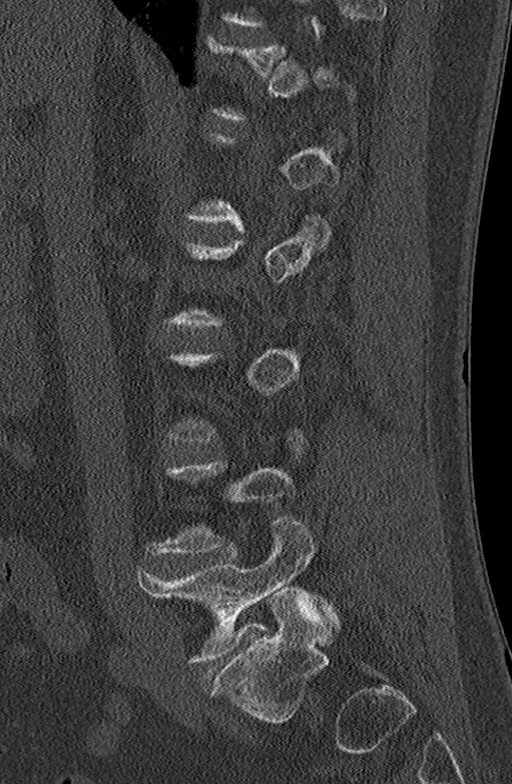
[im 35/84  bone]
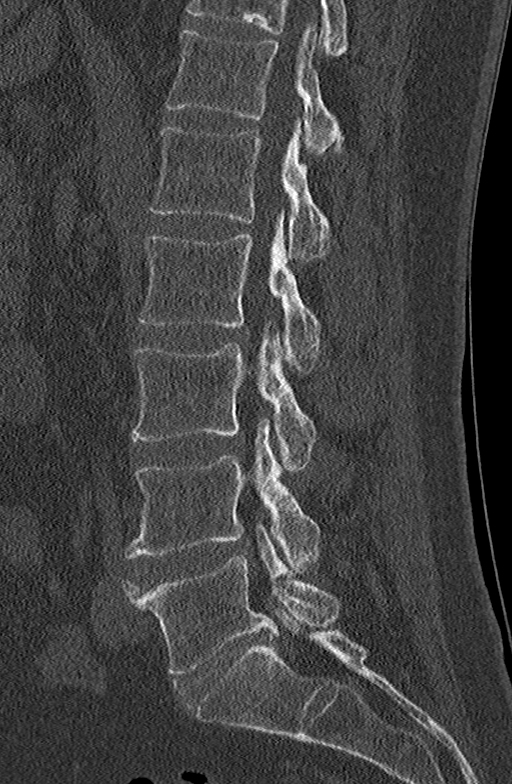
[im 42/84  soft-tissue]
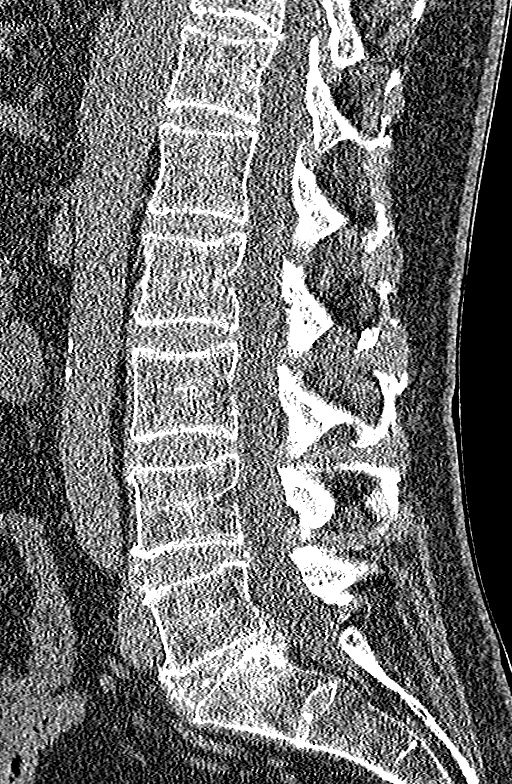
[im 42/84  bone]
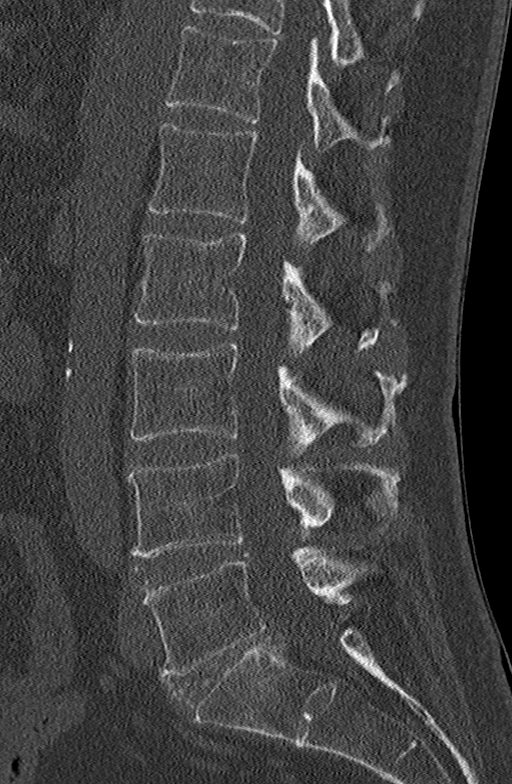
[im 49/84  bone]
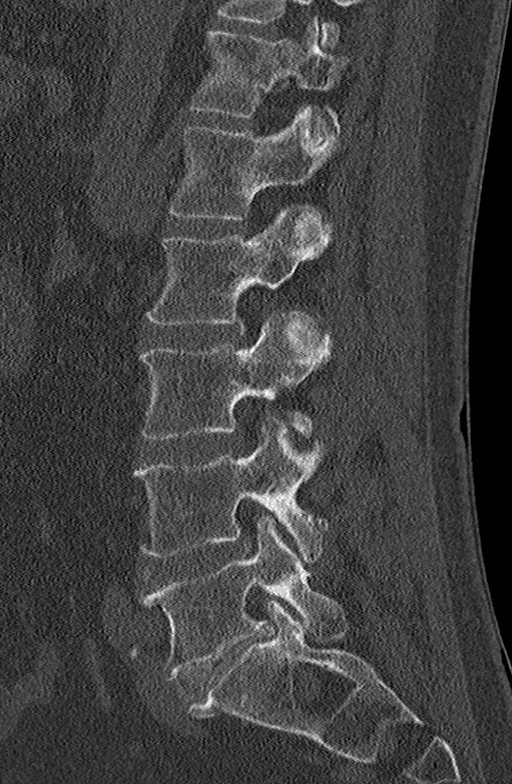
[im 56/84  bone]
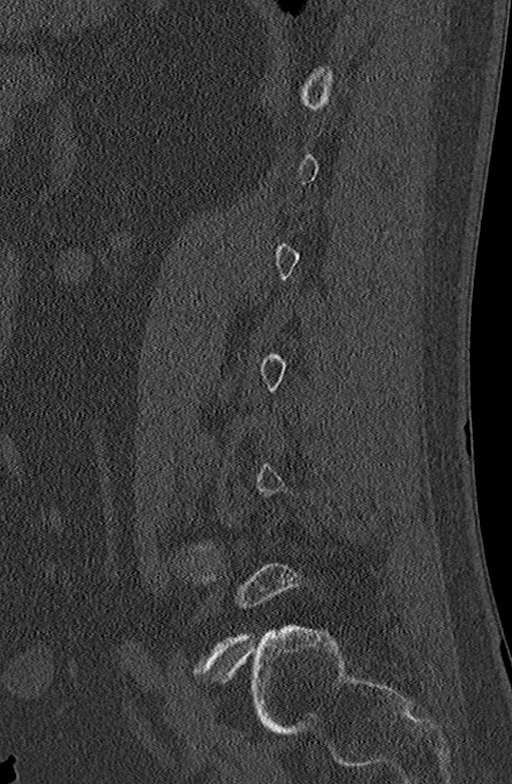

[Series 11: lspine bone cor · coronal · 0.39mm/px · 3 of 94 slices shown]
[im 19/94  bone]
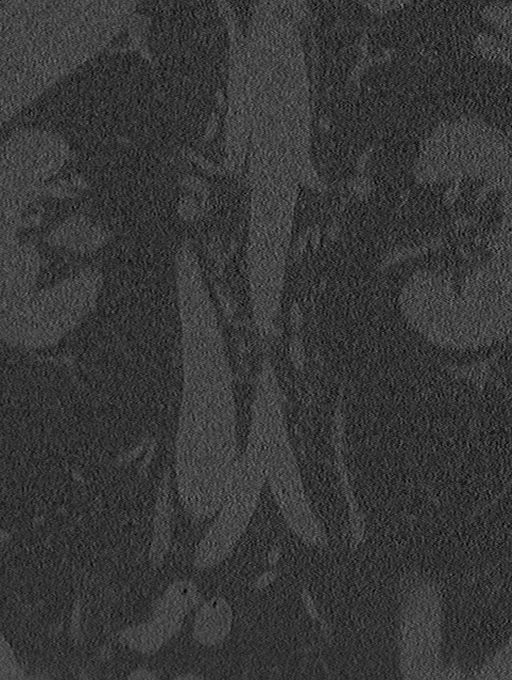
[im 38/94  bone]
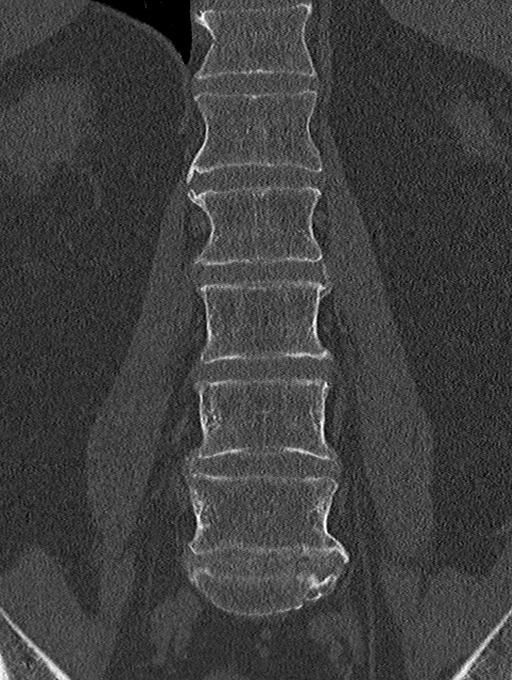
[im 56/94  bone]
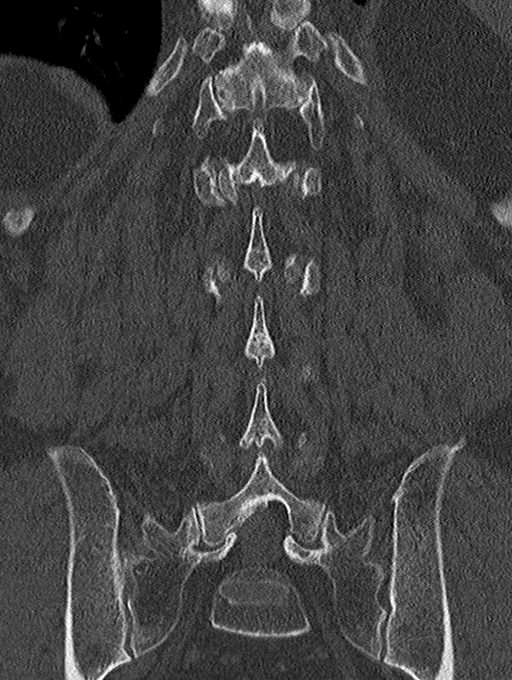

[13 of 33 positions shown; findings below may reference images not displayed]

FINDINGS: Segmentation: There are five lumbar type vertebral bodies. The last
full intervertebral disc space is labeled L5-S1.

Alignment: Normal

Vertebrae: No bone lesions or fractures. No pars defects.

Paraspinal and other soft tissues: No significant paraspinal or
retroperitoneal findings. Minimal scattered aortic calcifications.

Disc levels: No disc protrusions, significant spinal or foraminal
stenosis from L1-2 down through L4-5. Moderate lower lumbar facet
disease but no pars defects.

A left-sided laminectomy defect is noted at L5-S1. Speckled
calcifications in the L5-S1 disc space. There is also rim like
calcification of a bulging annulus contributing to mild foraminal
stenosis bilaterally.
IMPRESSION: 1. Normal alignment and no acute bony findings.
2. Bulging annulus at L5-S1 along with rim like calcifications and
spurring contributing to bilateral foraminal stenosis.
3. Left-sided laminectomy defect at L5-S1.

## 2022-11-21 ENCOUNTER — Telehealth: Payer: Self-pay

## 2022-11-21 ENCOUNTER — Encounter: Payer: Self-pay | Admitting: Family Medicine

## 2022-11-21 ENCOUNTER — Ambulatory Visit: Payer: BC Managed Care – PPO | Admitting: Family Medicine

## 2022-11-21 VITALS — BP 148/78 | HR 78 | Temp 98.0°F | Ht 70.0 in | Wt 202.0 lb

## 2022-11-21 DIAGNOSIS — I10 Essential (primary) hypertension: Secondary | ICD-10-CM | POA: Diagnosis not present

## 2022-11-21 DIAGNOSIS — R972 Elevated prostate specific antigen [PSA]: Secondary | ICD-10-CM | POA: Diagnosis not present

## 2022-11-21 DIAGNOSIS — R739 Hyperglycemia, unspecified: Secondary | ICD-10-CM

## 2022-11-21 DIAGNOSIS — F411 Generalized anxiety disorder: Secondary | ICD-10-CM | POA: Diagnosis not present

## 2022-11-21 DIAGNOSIS — J019 Acute sinusitis, unspecified: Secondary | ICD-10-CM | POA: Diagnosis not present

## 2022-11-21 MED ORDER — CITALOPRAM HYDROBROMIDE 20 MG PO TABS: ORAL_TABLET | ORAL | 1 refills | Status: DC

## 2022-11-21 MED ORDER — METOPROLOL SUCCINATE ER 100 MG PO TB24: 100.0000 mg | ORAL_TABLET | Freq: Every day | ORAL | 0 refills | Status: DC

## 2022-11-21 MED ORDER — AMOXICILLIN-POT CLAVULANATE 875-125 MG PO TABS: 1.0000 | ORAL_TABLET | Freq: Two times a day (BID) | ORAL | 0 refills | Status: DC

## 2022-11-21 NOTE — Telephone Encounter (Signed)
Encourage patient to contact the pharmacy for refills or they can request refills through [facility]  (Please schedule appointment if patient has not been seen in over a year)    [place]: [id number] [place] 20 MG tablet   NOTES/COMMENTS FROM PATIENT:      [place] please notify patient: It takes 48-72 hours to process rx refill requests Ask patient to call pharmacy to ensure rx is ready before heading there.

## 2022-11-21 NOTE — Progress Notes (Signed)
   Subjective:    Patient ID: Jack Gilbert, male    DOB: 1956-07-24, 66 y.o.   MRN: [id number]  HPI Sinusitis x 4 days , facial pain  Post nasal drainage , scratchy throat  Refill request for all meds Patient states blood pressure is somewhat elevated because he has not been eating as healthy lately Stress levels overall are doing well States Celexa helps his anxiety Denies any major setbacks recently Also relates a lot of head congestion drainage coughing the past few days with sinus pressure Review of Systems     Objective:   Physical Exam General-in no acute distress Eyes-no discharge Lungs-respiratory rate normal, CTA CV-no murmurs,RRR Extremities skin warm dry no edema Neuro grossly normal Behavior normal, alert   Patient states he will do a flu shot next week     Assessment & Plan:  1. GAD (generalized anxiety disorder) 6 his moods overall are doing well currently.  He states he ran out of the medicine for several days could notice a difference but now would like to restart - citalopram (CELEXA) 20 MG tablet; TAKE 1 TABLET BY MOUTH EVERY DAY  Dispense: 90 tablet; Refill: 1  2. Essential hypertension Blood pressure is mildly elevated compared to where it should be he states he has been eating a lot of salt recently she will work hard on healthier eating. - metoprolol succinate (TOPROL-XL) 100 MG 24 hr tablet; Take 1 tablet (100 mg total) by mouth daily. Take with or immediately following a meal.  Dispense: 90 tablet; Refill: 0  3. Elevated PSA Recheck PSA referral to Dr. Alyson Ingles he would like to follow locally for this issue  4. Acute rhinosinusitis Sinus infection Augmentin for 10 days patient encouraged to do COVID test at home  Wellness exam within the next 3 months recommended  Colonoscopy recommended patient defers

## 2022-12-07 DIAGNOSIS — R768 Other specified abnormal immunological findings in serum: Secondary | ICD-10-CM | POA: Diagnosis not present

## 2022-12-07 DIAGNOSIS — M353 Polymyalgia rheumatica: Secondary | ICD-10-CM | POA: Diagnosis not present

## 2022-12-07 DIAGNOSIS — Z1589 Genetic susceptibility to other disease: Secondary | ICD-10-CM | POA: Diagnosis not present

## 2022-12-07 DIAGNOSIS — M059 Rheumatoid arthritis with rheumatoid factor, unspecified: Secondary | ICD-10-CM | POA: Diagnosis not present

## 2022-12-14 DIAGNOSIS — R972 Elevated prostate specific antigen [PSA]: Secondary | ICD-10-CM | POA: Diagnosis not present

## 2022-12-14 DIAGNOSIS — I10 Essential (primary) hypertension: Secondary | ICD-10-CM | POA: Diagnosis not present

## 2022-12-15 ENCOUNTER — Encounter: Payer: Self-pay | Admitting: Urology

## 2022-12-15 ENCOUNTER — Ambulatory Visit: Payer: BC Managed Care – PPO | Admitting: Urology

## 2022-12-15 VITALS — BP 138/81 | HR 67

## 2022-12-15 DIAGNOSIS — N401 Enlarged prostate with lower urinary tract symptoms: Secondary | ICD-10-CM

## 2022-12-15 DIAGNOSIS — N138 Other obstructive and reflux uropathy: Secondary | ICD-10-CM

## 2022-12-15 DIAGNOSIS — R972 Elevated prostate specific antigen [PSA]: Secondary | ICD-10-CM | POA: Diagnosis not present

## 2022-12-15 DIAGNOSIS — R3912 Poor urinary stream: Secondary | ICD-10-CM | POA: Diagnosis not present

## 2022-12-15 LAB — BASIC METABOLIC PANEL
BUN/Creatinine Ratio: 17 (ref 10–24)
BUN: 14 mg/dL (ref 8–27)
CO2: 23 mmol/L (ref 20–29)
Calcium: 9.6 mg/dL (ref 8.6–10.2)
Chloride: 101 mmol/L (ref 96–106)
Creatinine, Ser: 0.83 mg/dL (ref 0.76–1.27)
Glucose: 141 mg/dL — ABNORMAL HIGH (ref 70–99)
Potassium: 4.9 mmol/L (ref 3.5–5.2)
Sodium: 139 mmol/L (ref 134–144)
eGFR: 98 mL/min/{1.73_m2} (ref >59)

## 2022-12-15 LAB — LIPID PANEL
Chol/HDL Ratio: 4.1 ratio (ref 0.0–5.0)
Cholesterol, Total: 195 mg/dL (ref 100–199)
HDL: 47 mg/dL (ref >39)
LDL Chol Calc (NIH): 130 mg/dL — ABNORMAL HIGH (ref 0–99)
Triglycerides: 97 mg/dL (ref 0–149)
VLDL Cholesterol Cal: 18 mg/dL (ref 5–40)

## 2022-12-15 LAB — PSA: Prostate Specific Ag, Serum: 3.4 ng/mL (ref 0.0–4.0)

## 2022-12-15 MED ORDER — TADALAFIL 5 MG PO TABS: 5.0000 mg | ORAL_TABLET | Freq: Every day | ORAL | 11 refills | Status: AC
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
## 2022-12-15 NOTE — Patient Instructions (Signed)

## 2022-12-26 NOTE — Addendum Note (Signed)
Addended by: Dairl Ponder on: 12/26/2022 08:53 AM   Modules accepted: Orders

## 2023-01-04 NOTE — Telephone Encounter (Signed)
Tried calling but mailbox is 01/04/23

## 2023-01-05 DIAGNOSIS — R739 Hyperglycemia, unspecified: Secondary | ICD-10-CM | POA: Diagnosis not present

## 2023-01-06 LAB — HEMOGLOBIN A1C
Est. average glucose Bld gHb Est-mCnc: 126 mg/dL
Hgb A1c MFr Bld: 6 % — ABNORMAL HIGH (ref 4.8–5.6)

## 2023-01-11 ENCOUNTER — Ambulatory Visit: Payer: BC Managed Care – PPO | Admitting: Family Medicine

## 2023-01-11 VITALS — BP 133/67 | Wt 198.4 lb

## 2023-01-11 DIAGNOSIS — E785 Hyperlipidemia, unspecified: Secondary | ICD-10-CM | POA: Diagnosis not present

## 2023-01-11 DIAGNOSIS — M353 Polymyalgia rheumatica: Secondary | ICD-10-CM

## 2023-01-11 DIAGNOSIS — M05741 Rheumatoid arthritis with rheumatoid factor of right hand without organ or systems involvement: Secondary | ICD-10-CM

## 2023-01-11 DIAGNOSIS — M05742 Rheumatoid arthritis with rheumatoid factor of left hand without organ or systems involvement: Secondary | ICD-10-CM

## 2023-01-11 DIAGNOSIS — R7303 Prediabetes: Secondary | ICD-10-CM

## 2023-01-11 NOTE — Patient Instructions (Signed)
Results for orders placed or performed in visit on 11/21/22  PSA  Result Value Ref Range   Prostate Specific Ag, Serum 3.4 0.0 - 4.0 ng/mL  Lipid Panel  Result Value Ref Range   Cholesterol, Total 195 100 - 199 mg/dL   Triglycerides 97 0 - 149 mg/dL   HDL 47 >39 mg/dL   VLDL Cholesterol Cal 18 5 - 40 mg/dL   LDL Chol Calc (NIH) 130 (H) 0 - 99 mg/dL   Chol/HDL Ratio 4.1 0.0 - 5.0 ratio  Basic Metabolic Panel  Result Value Ref Range   Glucose 141 (H) 70 - 99 mg/dL   BUN 14 8 - 27 mg/dL   Creatinine, Ser 0.83 0.76 - 1.27 mg/dL   eGFR 98 >59 mL/min/1.73   BUN/Creatinine Ratio 17 10 - 24   Sodium 139 134 - 144 mmol/L   Potassium 4.9 3.5 - 5.2 mmol/L   Chloride 101 96 - 106 mmol/L   CO2 23 20 - 29 mmol/L   Calcium 9.6 8.6 - 10.2 mg/dL  Hemoglobin A1c  Result Value Ref Range   Hgb A1c MFr Bld 6.0 (H) 4.8 - 5.6 %   Est. average glucose Bld gHb Est-mCnc 126 mg/dL

## 2023-01-11 NOTE — Progress Notes (Signed)
Subjective:    Patient ID: Jack Gilbert, male    DOB: [birth year], 67 y.o.   MRN: [id number]  HPI Patient arrives to discuss lab work. Heart rate is under good control with medicine He also sees rheumatology for treatment of his rheumatoid arthritis he has been on prednisone but they are start to taper that down The 10-year ASCVD risk score (Arnett DK, et al., 2019) is: 14.9%   Values used to calculate the score:     Age: 25 years     Sex: Male     Is Non-Hispanic African American: No     Diabetic: No     Tobacco smoker: No     Systolic Blood Pressure: [id number] mmHg     Is BP treated: Yes     HDL Cholesterol: 47 mg/dL     Total Cholesterol: 195 mg/dL We discussed how the increased LDL puts him at a higher risk of heart disease we discussed the pluses and minuses of doing statins as well as doing a coronary calcium patient would prefer not to be on medicine if he does not have to be We also discussed prediabetes the implications of this and the importance of modifying diet we talked about dietary choices that would be better   Review of Systems     Objective:   Physical Exam General-in no acute distress Eyes-no discharge Lungs-respiratory rate normal, CTA CV-no murmurs,RRR Extremities skin warm dry no edema Neuro grossly normal Behavior normal, alert  Results for orders placed or performed in visit on 11/21/22  PSA  Result Value Ref Range   Prostate Specific Ag, Serum 3.4 0.0 - 4.0 ng/mL  Lipid Panel  Result Value Ref Range   Cholesterol, Total 195 100 - 199 mg/dL   Triglycerides 97 0 - 149 mg/dL   HDL 47 >39 mg/dL   VLDL Cholesterol Cal 18 5 - 40 mg/dL   LDL Chol Calc (NIH) 130 (H) 0 - 99 mg/dL   Chol/HDL Ratio 4.1 0.0 - 5.0 ratio  Basic Metabolic Panel  Result Value Ref Range   Glucose 141 (H) 70 - 99 mg/dL   BUN 14 8 - 27 mg/dL   Creatinine, Ser 0.83 0.76 - 1.27 mg/dL   eGFR 98 >59 mL/min/1.73   BUN/Creatinine Ratio 17 10 - 24   Sodium 139 134 - 144 mmol/L    Potassium 4.9 3.5 - 5.2 mmol/L   Chloride 101 96 - 106 mmol/L   CO2 23 20 - 29 mmol/L   Calcium 9.6 8.6 - 10.2 mg/dL  Hemoglobin A1c  Result Value Ref Range   Hgb A1c MFr Bld 6.0 (H) 4.8 - 5.6 %   Est. average glucose Bld gHb Est-mCnc 126 mg/dL    Labs reviewed in detail PSA looks good kidney functions look good prediabetes is noted cholesterol slight elevation     Assessment & Plan:  1. Hyperlipidemia, unspecified hyperlipidemia type Will do CT coronary calcium to rule out the possibility of coronary artery disease may need further intervention on lipids with statin - CT CARDIAC SCORING (SELF PAY ONLY)  2. Prediabetes Healthy diet portion control regular physical activity - CT CARDIAC SCORING (SELF PAY ONLY) Rheumatoid arthritis Follow-up based upon the findings of the CT cardiac calcium score  Patient with polymyalgia rheumatica being treated with prednisone they are slowly tapering this down  Rheumatoid arthritis stable currently it does seem like it will be a good idea for him to taper down on the prednisone as guided by  the rheumatologist

## 2023-01-12 MED ORDER — CITALOPRAM HYDROBROMIDE 20 MG PO TABS: ORAL_TABLET | ORAL | 0 refills | Status: DC

## 2023-01-12 NOTE — Telephone Encounter (Signed)
Had appointment on 01/11/23

## 2023-01-12 NOTE — Addendum Note (Signed)
Addended by: Dairl Ponder on: 01/12/2023 04:13 PM   Modules accepted: Orders

## 2023-03-01 ENCOUNTER — Ambulatory Visit (HOSPITAL_COMMUNITY)
Admission: RE | Admit: 2023-03-01 | Discharge: 2023-03-01 | Disposition: A | Discharge status: Home / Self Care | Payer: BC Managed Care – PPO | Source: Ambulatory Visit | Attending: Family Medicine | Admitting: Family Medicine

## 2023-03-01 DIAGNOSIS — E785 Hyperlipidemia, unspecified: Secondary | ICD-10-CM | POA: Insufficient documentation

## 2023-03-01 DIAGNOSIS — R7303 Prediabetes: Secondary | ICD-10-CM | POA: Insufficient documentation

## 2023-03-04 ENCOUNTER — Encounter: Payer: Self-pay | Admitting: Family Medicine

## 2023-03-04 NOTE — Progress Notes (Signed)
Please mail to the patient 

## 2023-03-13 DIAGNOSIS — M353 Polymyalgia rheumatica: Secondary | ICD-10-CM | POA: Diagnosis not present

## 2023-03-13 DIAGNOSIS — R5383 Other fatigue: Secondary | ICD-10-CM | POA: Diagnosis not present

## 2023-03-13 DIAGNOSIS — M064 Inflammatory polyarthropathy: Secondary | ICD-10-CM | POA: Diagnosis not present

## 2023-03-13 DIAGNOSIS — M1991 Primary osteoarthritis, unspecified site: Secondary | ICD-10-CM | POA: Diagnosis not present

## 2023-03-13 DIAGNOSIS — Z1589 Genetic susceptibility to other disease: Secondary | ICD-10-CM | POA: Diagnosis not present

## 2023-03-13 DIAGNOSIS — M0579 Rheumatoid arthritis with rheumatoid factor of multiple sites without organ or systems involvement: Secondary | ICD-10-CM | POA: Diagnosis not present

## 2023-03-14 DIAGNOSIS — L820 Inflamed seborrheic keratosis: Secondary | ICD-10-CM | POA: Diagnosis not present

## 2023-03-14 DIAGNOSIS — L570 Actinic keratosis: Secondary | ICD-10-CM | POA: Diagnosis not present

## 2023-03-14 DIAGNOSIS — X32XXXD Exposure to sunlight, subsequent encounter: Secondary | ICD-10-CM | POA: Diagnosis not present

## 2023-03-14 DIAGNOSIS — B078 Other viral warts: Secondary | ICD-10-CM | POA: Diagnosis not present

## 2023-04-08 ENCOUNTER — Other Ambulatory Visit: Payer: Self-pay | Admitting: Family Medicine

## 2023-04-08 DIAGNOSIS — I10 Essential (primary) hypertension: Secondary | ICD-10-CM

## 2023-05-26 ENCOUNTER — Other Ambulatory Visit: Payer: Self-pay | Admitting: Family Medicine

## 2023-05-26 DIAGNOSIS — F411 Generalized anxiety disorder: Secondary | ICD-10-CM

## 2023-06-18 ENCOUNTER — Other Ambulatory Visit: Payer: BC Managed Care – PPO

## 2023-06-18 DIAGNOSIS — R972 Elevated prostate specific antigen [PSA]: Secondary | ICD-10-CM | POA: Diagnosis not present

## 2023-06-21 LAB — PSA, TOTAL AND FREE
PSA, Free Pct: 28.8 %
Prostate Specific Ag, Serum: 6.4 ng/mL — ABNORMAL HIGH (ref 0.0–4.0)

## 2023-06-27 ENCOUNTER — Ambulatory Visit: Payer: BC Managed Care – PPO | Admitting: Urology

## 2023-06-27 VITALS — BP 137/73 | HR 71

## 2023-06-27 DIAGNOSIS — R972 Elevated prostate specific antigen [PSA]: Secondary | ICD-10-CM

## 2023-06-27 LAB — URINALYSIS, ROUTINE W REFLEX MICROSCOPIC
Bilirubin, UA: NEGATIVE
Glucose, UA: NEGATIVE
Ketones, UA: NEGATIVE
Leukocytes,UA: NEGATIVE
Nitrite, UA: NEGATIVE
Protein,UA: NEGATIVE
RBC, UA: NEGATIVE
Specific Gravity, UA: 1.025 (ref 1.005–1.030)
Urobilinogen, Ur: 0.2 mg/dL (ref 0.2–1.0)
pH, UA: 6 (ref 5.0–7.5)
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
## 2023-07-19 ENCOUNTER — Encounter: Payer: Self-pay | Admitting: Urology

## 2023-07-19 NOTE — Patient Instructions (Signed)

## 2023-08-25 ENCOUNTER — Other Ambulatory Visit: Payer: Self-pay | Admitting: Family Medicine

## 2023-08-25 DIAGNOSIS — F411 Generalized anxiety disorder: Secondary | ICD-10-CM

## 2023-08-31 ENCOUNTER — Other Ambulatory Visit: Payer: Self-pay | Admitting: Family Medicine

## 2023-08-31 DIAGNOSIS — Z1212 Encounter for screening for malignant neoplasm of rectum: Secondary | ICD-10-CM

## 2023-08-31 DIAGNOSIS — Z1211 Encounter for screening for malignant neoplasm of colon: Secondary | ICD-10-CM

## 2023-09-21 ENCOUNTER — Other Ambulatory Visit: Payer: BC Managed Care – PPO

## 2023-09-21 DIAGNOSIS — R972 Elevated prostate specific antigen [PSA]: Secondary | ICD-10-CM | POA: Diagnosis not present

## 2023-09-22 LAB — PSA, TOTAL AND FREE
PSA, Free Pct: 25.2 %
PSA, Free: 0.78 ng/mL
Prostate Specific Ag, Serum: 3.1 ng/mL (ref 0.0–4.0)

## 2023-09-28 ENCOUNTER — Ambulatory Visit: Payer: BC Managed Care – PPO | Admitting: Urology

## 2023-09-28 VITALS — BP 152/79 | HR 80

## 2023-09-28 DIAGNOSIS — R3912 Poor urinary stream: Secondary | ICD-10-CM | POA: Diagnosis not present

## 2023-09-28 DIAGNOSIS — N138 Other obstructive and reflux uropathy: Secondary | ICD-10-CM

## 2023-09-28 DIAGNOSIS — R972 Elevated prostate specific antigen [PSA]: Secondary | ICD-10-CM

## 2023-09-28 DIAGNOSIS — N401 Enlarged prostate with lower urinary tract symptoms: Secondary | ICD-10-CM | POA: Diagnosis not present

## 2023-09-28 LAB — URINALYSIS, ROUTINE W REFLEX MICROSCOPIC
Bilirubin, UA: NEGATIVE
Glucose, UA: NEGATIVE
Ketones, UA: NEGATIVE
Leukocytes,UA: NEGATIVE
Nitrite, UA: NEGATIVE
Protein,UA: NEGATIVE
RBC, UA: NEGATIVE
Specific Gravity, UA: 1.025 (ref 1.005–1.030)
Urobilinogen, Ur: 0.2 mg/dL (ref 0.2–1.0)
pH, UA: 6 (ref 5.0–7.5)

## 2023-09-28 MED ORDER — SILDENAFIL CITRATE 100 MG PO TABS: 100.0000 mg | ORAL_TABLET | Freq: Every day | ORAL | 0 refills | Status: DC | PRN

## 2023-09-28 MED ORDER — TAMSULOSIN HCL 0.4 MG PO CAPS
0.4000 mg | ORAL_CAPSULE | Freq: Every day | ORAL | 11 refills | Status: DC
Start: 2023-09-28 — End: 2023-10-12

## 2023-09-28 MED ORDER — AZITHROMYCIN 250 MG PO TABS
ORAL_TABLET | ORAL | 0 refills | Status: DC
Start: 2023-09-28 — End: 2024-04-28

## 2023-09-28 MED ORDER — CYCLOBENZAPRINE HCL 5 MG PO TABS: 5.0000 mg | ORAL_TABLET | Freq: Two times a day (BID) | ORAL | 1 refills | Status: DC | PRN
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
## 2023-09-30 ENCOUNTER — Other Ambulatory Visit: Payer: Self-pay | Admitting: Family Medicine

## 2023-09-30 DIAGNOSIS — M0579 Rheumatoid arthritis with rheumatoid factor of multiple sites without organ or systems involvement: Secondary | ICD-10-CM

## 2023-10-02 ENCOUNTER — Encounter: Payer: Self-pay | Admitting: Urology

## 2023-10-02 NOTE — Patient Instructions (Signed)

## 2023-10-05 DIAGNOSIS — M9903 Segmental and somatic dysfunction of lumbar region: Secondary | ICD-10-CM | POA: Diagnosis not present

## 2023-10-05 DIAGNOSIS — M546 Pain in thoracic spine: Secondary | ICD-10-CM | POA: Diagnosis not present

## 2023-10-05 DIAGNOSIS — M5441 Lumbago with sciatica, right side: Secondary | ICD-10-CM | POA: Diagnosis not present

## 2023-10-05 DIAGNOSIS — M9902 Segmental and somatic dysfunction of thoracic region: Secondary | ICD-10-CM | POA: Diagnosis not present

## 2023-10-08 ENCOUNTER — Other Ambulatory Visit: Payer: Self-pay | Admitting: Family Medicine

## 2023-10-08 DIAGNOSIS — I10 Essential (primary) hypertension: Secondary | ICD-10-CM

## 2023-10-12 ENCOUNTER — Ambulatory Visit: Payer: BC Managed Care – PPO | Admitting: Urology

## 2023-10-12 VITALS — BP 116/62 | HR 79

## 2023-10-12 DIAGNOSIS — R3912 Poor urinary stream: Secondary | ICD-10-CM | POA: Diagnosis not present

## 2023-10-12 DIAGNOSIS — M9903 Segmental and somatic dysfunction of lumbar region: Secondary | ICD-10-CM | POA: Diagnosis not present

## 2023-10-12 DIAGNOSIS — M546 Pain in thoracic spine: Secondary | ICD-10-CM | POA: Diagnosis not present

## 2023-10-12 DIAGNOSIS — N138 Other obstructive and reflux uropathy: Secondary | ICD-10-CM

## 2023-10-12 DIAGNOSIS — M5441 Lumbago with sciatica, right side: Secondary | ICD-10-CM | POA: Diagnosis not present

## 2023-10-12 DIAGNOSIS — R972 Elevated prostate specific antigen [PSA]: Secondary | ICD-10-CM | POA: Diagnosis not present

## 2023-10-12 DIAGNOSIS — M9902 Segmental and somatic dysfunction of thoracic region: Secondary | ICD-10-CM | POA: Diagnosis not present

## 2023-10-12 DIAGNOSIS — N401 Enlarged prostate with lower urinary tract symptoms: Secondary | ICD-10-CM | POA: Diagnosis not present

## 2023-10-12 LAB — URINALYSIS, ROUTINE W REFLEX MICROSCOPIC
Bilirubin, UA: NEGATIVE
Glucose, UA: NEGATIVE
Ketones, UA: NEGATIVE
Leukocytes,UA: NEGATIVE
Nitrite, UA: NEGATIVE
Protein,UA: NEGATIVE
RBC, UA: NEGATIVE
Specific Gravity, UA: 1.02 (ref 1.005–1.030)
Urobilinogen, Ur: 0.2 mg/dL (ref 0.2–1.0)
pH, UA: 6 (ref 5.0–7.5)

## 2023-10-12 MED ORDER — TAMSULOSIN HCL 0.4 MG PO CAPS: 0.4000 mg | ORAL_CAPSULE | Freq: Every day | ORAL | 11 refills | Status: DC
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
## 2023-10-16 ENCOUNTER — Encounter: Payer: Self-pay | Admitting: Urology

## 2023-10-16 NOTE — Patient Instructions (Signed)

## 2023-10-19 DIAGNOSIS — M546 Pain in thoracic spine: Secondary | ICD-10-CM | POA: Diagnosis not present

## 2023-10-19 DIAGNOSIS — M5441 Lumbago with sciatica, right side: Secondary | ICD-10-CM | POA: Diagnosis not present

## 2023-10-19 DIAGNOSIS — M9902 Segmental and somatic dysfunction of thoracic region: Secondary | ICD-10-CM | POA: Diagnosis not present

## 2023-10-19 DIAGNOSIS — M9903 Segmental and somatic dysfunction of lumbar region: Secondary | ICD-10-CM | POA: Diagnosis not present

## 2023-10-23 ENCOUNTER — Telehealth: Payer: Self-pay

## 2023-10-23 NOTE — Telephone Encounter (Signed)
Patient needing a call back regarding recent labs. Read them in MyChart, did not understand.    Please advise today.

## 2023-10-23 NOTE — Telephone Encounter (Signed)
Please review ISOpsa per patient request.

## 2023-10-24 NOTE — Telephone Encounter (Signed)
Verbal from Dr. Ronne Binning, psa normal ISO psa not done, pt to follow up as scheduled with labs and office visit.  Patient aware and voiced understanding.

## 2024-01-03 DIAGNOSIS — R509 Fever, unspecified: Secondary | ICD-10-CM | POA: Diagnosis not present

## 2024-01-03 DIAGNOSIS — J101 Influenza due to other identified influenza virus with other respiratory manifestations: Secondary | ICD-10-CM | POA: Diagnosis not present

## 2024-01-13 ENCOUNTER — Other Ambulatory Visit: Payer: Self-pay | Admitting: Family Medicine

## 2024-01-13 DIAGNOSIS — F411 Generalized anxiety disorder: Secondary | ICD-10-CM

## 2024-01-14 ENCOUNTER — Other Ambulatory Visit: Payer: Self-pay | Admitting: Family Medicine

## 2024-01-14 DIAGNOSIS — F411 Generalized anxiety disorder: Secondary | ICD-10-CM

## 2024-01-14 NOTE — Telephone Encounter (Signed)
MyChart Message sent to pt regarding this

## 2024-01-14 NOTE — Telephone Encounter (Signed)
Nurses Please connect with patient He needs a follow-up visit Please schedule within the next 90 days Once this is scheduled I will send in additional medication but will not send in additional medicine until this is scheduled thank you

## 2024-02-23 ENCOUNTER — Other Ambulatory Visit: Payer: Self-pay | Admitting: Family Medicine

## 2024-02-23 DIAGNOSIS — I10 Essential (primary) hypertension: Secondary | ICD-10-CM

## 2024-03-02 ENCOUNTER — Other Ambulatory Visit: Payer: Self-pay | Admitting: Family Medicine

## 2024-03-02 DIAGNOSIS — F411 Generalized anxiety disorder: Secondary | ICD-10-CM

## 2024-03-03 ENCOUNTER — Other Ambulatory Visit: Payer: Self-pay | Admitting: Family Medicine

## 2024-03-03 DIAGNOSIS — F411 Generalized anxiety disorder: Secondary | ICD-10-CM

## 2024-03-05 ENCOUNTER — Other Ambulatory Visit: Payer: Self-pay | Admitting: Family Medicine

## 2024-03-05 DIAGNOSIS — I10 Essential (primary) hypertension: Secondary | ICD-10-CM

## 2024-03-05 MED ORDER — METOPROLOL SUCCINATE ER 100 MG PO TB24: 100.0000 mg | ORAL_TABLET | Freq: Every day | ORAL | 3 refills | Status: AC

## 2024-03-05 NOTE — Telephone Encounter (Signed)
 Copied from CRM [id number]. Topic: Clinical - Medication Refill >> Mar 05, 2024  1:45 PM Geroge Baseman wrote: Most Recent Primary Care Visit:  Provider: Lilyan Punt A  Department: [facility]  Visit Type: OFFICE VISIT  Date: 01/11/2023  Medication: metoprolol succinate (TOPROL-XL) 100 MG 24 hr tablet  Has the patient contacted their pharmacy? Yes (Agent: If no, request that the patient contact the pharmacy for the refill. If patient does not wish to contact the pharmacy document the reason why and proceed with request.) (Agent: If yes, when and what did the pharmacy advise?)  Is this the correct pharmacy for this prescription? Yes If no, delete pharmacy and type the correct one.  This is the patient's preferred pharmacy:   [id number] - [place], [place] - [address] AT [facility] [address]   [address] Phone: [phone] Fax: [phone]   Has the prescription been filled recently? No  Is the patient out of the medication? Yes  Has the patient been seen for an appointment in the last year OR does the patient have an upcoming appointment? Yes  Can we respond through MyChart? Yes  Agent: Please be advised that Rx refills may take up to 3 business days. We ask that you follow-up with your pharmacy.

## 2024-03-21 ENCOUNTER — Other Ambulatory Visit: Payer: BC Managed Care – PPO

## 2024-03-25 ENCOUNTER — Other Ambulatory Visit

## 2024-03-26 LAB — PSA: Prostate Specific Ag, Serum: 3 ng/mL (ref 0.0–4.0)

## 2024-03-28 ENCOUNTER — Ambulatory Visit (INDEPENDENT_AMBULATORY_CARE_PROVIDER_SITE_OTHER): Payer: BC Managed Care – PPO | Admitting: Urology

## 2024-03-28 ENCOUNTER — Encounter: Payer: Self-pay | Admitting: Urology

## 2024-03-28 VITALS — BP 149/74 | HR 74

## 2024-03-28 DIAGNOSIS — R972 Elevated prostate specific antigen [PSA]: Secondary | ICD-10-CM

## 2024-03-28 DIAGNOSIS — N138 Other obstructive and reflux uropathy: Secondary | ICD-10-CM | POA: Diagnosis not present

## 2024-03-28 DIAGNOSIS — N401 Enlarged prostate with lower urinary tract symptoms: Secondary | ICD-10-CM | POA: Diagnosis not present

## 2024-03-28 DIAGNOSIS — R3912 Poor urinary stream: Secondary | ICD-10-CM

## 2024-03-28 LAB — URINALYSIS, ROUTINE W REFLEX MICROSCOPIC
Bilirubin, UA: NEGATIVE
Glucose, UA: NEGATIVE
Ketones, UA: NEGATIVE
Leukocytes,UA: NEGATIVE
Nitrite, UA: NEGATIVE
Protein,UA: NEGATIVE
RBC, UA: NEGATIVE
Specific Gravity, UA: 1.025 (ref 1.005–1.030)
Urobilinogen, Ur: 0.2 mg/dL (ref 0.2–1.0)
pH, UA: 6 (ref 5.0–7.5)
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
## 2024-03-28 NOTE — Patient Instructions (Signed)

## 2024-04-28 ENCOUNTER — Ambulatory Visit: Admitting: Family Medicine

## 2024-04-28 ENCOUNTER — Encounter: Payer: Self-pay | Admitting: Family Medicine

## 2024-04-28 VITALS — BP 118/68 | HR 60 | Temp 97.7°F | Ht 70.0 in | Wt 194.6 lb

## 2024-04-28 DIAGNOSIS — E785 Hyperlipidemia, unspecified: Secondary | ICD-10-CM

## 2024-04-28 DIAGNOSIS — Z79899 Other long term (current) drug therapy: Secondary | ICD-10-CM

## 2024-04-28 DIAGNOSIS — R7309 Other abnormal glucose: Secondary | ICD-10-CM

## 2024-04-28 DIAGNOSIS — M0579 Rheumatoid arthritis with rheumatoid factor of multiple sites without organ or systems involvement: Secondary | ICD-10-CM | POA: Diagnosis not present

## 2024-04-28 DIAGNOSIS — F411 Generalized anxiety disorder: Secondary | ICD-10-CM

## 2024-04-28 MED ORDER — DICLOFENAC SODIUM 75 MG PO TBEC: 75.0000 mg | DELAYED_RELEASE_TABLET | Freq: Two times a day (BID) | ORAL | 1 refills | Status: AC

## 2024-04-28 MED ORDER — CITALOPRAM HYDROBROMIDE 20 MG PO TABS: ORAL_TABLET | ORAL | 0 refills | Status: DC

## 2024-04-28 NOTE — Progress Notes (Signed)
 Subjective:    Patient ID: Jack Gilbert, male    DOB: [birth year], 68 y.o.   MRN: [id number]  HPI Pt comes in today for Med check with no questions or concerns. Medications and allergies reviewed Discussed the use of AI scribe software for clinical note transcription with the patient, who gave verbal consent to proceed.  History of Present Illness   Jack Gilbert is a 68 year old male with seropositive rheumatoid arthritis who presents for medication management and follow-up.  He is currently taking diclofenac  1 tablet twice a day for his seropositive rheumatoid arthritis but is about to run out of his medication. He has not visited his rheumatologist recently. 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  He is under care for prostate health monitoring. His last PSA reading in the spring was 3.0. A biopsy was discussed but not pursued. He experiences a weak urinary stream but rarely has nocturia. He uses Cialis  5 mg daily and tamsulosin  for urinary symptoms.  He has a history of coronary artery disease, as indicated by a coronary calcium scan done about a year ago, which showed cholesterol buildup in his arteries. He frequently consumes hot dogs and cheeseburgers, which he recognizes are not the healthiest choices.  He takes citalopram  regularly for mood management and no longer needs oxazepam. He has hemorrhoid issues but denies noticing any blood in his bowel movements. He manages it with dietary fiber and stool softeners. He describes his stress levels as 'pretty good'.  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 He is contemplating retirement and has various home projects planned, though he finds it difficult to work on his knees for extended periods.       Review of Systems     Objective:   Physical Exam  General-in no acute distress Eyes-no discharge Lungs-respiratory  rate normal, CTA CV-no murmurs,RRR Extremities skin warm dry no edema Neuro grossly normal Behavior normal, alert       Assessment & Plan:   Assessment and Plan    Seropositive rheumatoid arthritis Chronic seropositive rheumatoid arthritis managed with diclofenac . Recommended rheumatology follow-up every six months to monitor disease progression and prevent joint damage. - Prescribe diclofenac  1 tablet twice daily. - Advise rheumatology follow-up every six months.  Hyperlipidemia Coronary calcium scan indicates cholesterol buildup, increasing long-term myocardial infarction risk. Recommended blood work to assess cholesterol levels and consider LDL-lowering medication. - Order blood work to assess cholesterol levels. - Discuss results and potential treatment options post-blood work.  Depression Depression managed with citalopram . - Prescribe citalopram .  Hemorrhoids Chronic hemorrhoids without hematochezia. Advised increased fiber intake or use of stool softeners to reduce symptoms.      1. Hyperlipidemia, unspecified hyperlipidemia type (Primary) Check lipid panel Previous coronary calcium I recommend for the patient to be on statin Patient not thrilled with being on medication He will think about it  - Lipid Panel  2. Elevated hemoglobin A1c Will check metabolic panel to see how his glucoses may need A1c - Basic Metabolic Panel  3. GAD (generalized anxiety disorder) Continue medication patient no longer on benzodiazepine - citalopram  (CELEXA ) 20 MG tablet; Take 1 daily needs to schedule follow-up office visit  Dispense: 90 tablet; Refill: 0  4. Rheumatoid arthritis with rheumatoid factor of multiple sites without organ or systems involvement [facility]) Patient was encouraged to get back with the rheumatologist at  least every 6 months - diclofenac  (VOLTAREN ) 75 MG EC tablet; Take 1 tablet (75 mg total) by mouth 2 (two) times daily.  Dispense: 180 tablet; Refill: 1  5.  High risk medication use Lab work ordered - Hepatic Function Panel Follow-up within 6 months

## 2024-08-08 ENCOUNTER — Encounter: Payer: Self-pay | Admitting: Emergency Medicine

## 2024-08-08 ENCOUNTER — Other Ambulatory Visit: Payer: Self-pay

## 2024-08-08 ENCOUNTER — Ambulatory Visit
Admission: EM | Admit: 2024-08-08 | Discharge: 2024-08-08 | Disposition: A | Discharge status: Home / Self Care | Attending: Nurse Practitioner | Admitting: Nurse Practitioner

## 2024-08-08 DIAGNOSIS — R21 Rash and other nonspecific skin eruption: Secondary | ICD-10-CM

## 2024-08-08 MED ORDER — TRIAMCINOLONE ACETONIDE 0.1 % EX CREA: 1.0000 | TOPICAL_CREAM | Freq: Two times a day (BID) | CUTANEOUS | 0 refills | Status: AC

## 2024-08-08 MED ORDER — PREDNISONE 20 MG PO TABS: 40.0000 mg | ORAL_TABLET | Freq: Every day | ORAL | 0 refills | Status: AC

## 2024-08-08 MED ORDER — DEXAMETHASONE SODIUM PHOSPHATE 10 MG/ML IJ SOLN
10.0000 mg | INTRAMUSCULAR | Status: AC
  Administered 2024-08-08: 10 mg via INTRAMUSCULAR
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
## 2024-08-08 NOTE — Discharge Instructions (Signed)
 You were given an injection of Decadron  10 mg.  Start the prednisone  tomorrow. Take medication as prescribed. You may take over-the-counter Zyrtec, Claritin, or Allegra during the daytime to help with itching, and Benadryl at bedtime. Avoid hot baths or showers while symptoms persist.  Recommend taking lukewarm baths while symptoms persist May apply cool cloths to the area to help with itching or discomfort. Avoid scratching, rubbing, or manipulating the areas while symptoms persist. Recommend over-the-counter Aveeno Colloidal Oatmeal Bath to use to help with drying and itching. If symptoms fail to improve, you may follow-up in this clinic or with your primary care physician for further evaluation. Follow-up as needed.

## 2024-08-08 NOTE — ED Triage Notes (Signed)
 Pt reports generalized itching and red insect bites possible exposure to chiggers on wednesday evening. Has not taken anything otc prior to UC today. Has tried cortizone 10 in the past with minimal improvement of itching.

## 2024-09-29 ENCOUNTER — Other Ambulatory Visit

## 2024-09-29 DIAGNOSIS — R972 Elevated prostate specific antigen [PSA]: Secondary | ICD-10-CM

## 2024-09-30 LAB — PSA, TOTAL AND FREE
PSA, Free Pct: 20.8 %
PSA, Free: 0.79 ng/mL
Prostate Specific Ag, Serum: 3.8 ng/mL (ref 0.0–4.0)

## 2024-10-06 ENCOUNTER — Ambulatory Visit: Admitting: Urology

## 2024-10-06 ENCOUNTER — Encounter: Payer: Self-pay | Admitting: Urology

## 2024-10-06 VITALS — BP 137/73 | HR 64

## 2024-10-06 DIAGNOSIS — N401 Enlarged prostate with lower urinary tract symptoms: Secondary | ICD-10-CM

## 2024-10-06 DIAGNOSIS — R972 Elevated prostate specific antigen [PSA]: Secondary | ICD-10-CM

## 2024-10-06 DIAGNOSIS — R3912 Poor urinary stream: Secondary | ICD-10-CM

## 2024-10-06 DIAGNOSIS — N138 Other obstructive and reflux uropathy: Secondary | ICD-10-CM

## 2024-10-06 LAB — URINALYSIS, ROUTINE W REFLEX MICROSCOPIC
Bilirubin, UA: NEGATIVE
Glucose, UA: NEGATIVE
Ketones, UA: NEGATIVE
Leukocytes,UA: NEGATIVE
Nitrite, UA: NEGATIVE
Protein,UA: NEGATIVE
RBC, UA: NEGATIVE
Specific Gravity, UA: 1.025 (ref 1.005–1.030)
Urobilinogen, Ur: 0.2 mg/dL (ref 0.2–1.0)
pH, UA: 5.5 (ref 5.0–7.5)
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
## 2024-11-28 ENCOUNTER — Other Ambulatory Visit: Payer: Self-pay | Admitting: Family Medicine

## 2024-11-28 DIAGNOSIS — F411 Generalized anxiety disorder: Secondary | ICD-10-CM

## 2025-04-01 ENCOUNTER — Other Ambulatory Visit

## 2025-04-08 ENCOUNTER — Ambulatory Visit: Admitting: Urology
